# Patient Record
Sex: Male | Born: 1970 | Race: White | Hispanic: No | Marital: Married | Smoking: Current every day smoker
Health system: Southern US, Community
[De-identification: ages and names within clinical notes are randomized; demographics above are authoritative.]

## PROBLEM LIST (undated history)

## (undated) DIAGNOSIS — F32A Depression, unspecified: Secondary | ICD-10-CM

## (undated) DIAGNOSIS — F419 Anxiety disorder, unspecified: Secondary | ICD-10-CM

## (undated) DIAGNOSIS — IMO0001 Reserved for inherently not codable concepts without codable children: Secondary | ICD-10-CM

## (undated) DIAGNOSIS — R519 Headache, unspecified: Secondary | ICD-10-CM

## (undated) DIAGNOSIS — G589 Mononeuropathy, unspecified: Secondary | ICD-10-CM

## (undated) DIAGNOSIS — F1910 Other psychoactive substance abuse, uncomplicated: Secondary | ICD-10-CM

## (undated) DIAGNOSIS — R51 Headache: Secondary | ICD-10-CM

## (undated) HISTORY — PX: OTHER SURGICAL HISTORY: SHX169

## (undated) HISTORY — DX: Other psychoactive substance abuse, uncomplicated: F19.10

## (undated) HISTORY — DX: Depression, unspecified: F32.A

---

## 2002-11-05 ENCOUNTER — Encounter: Payer: Self-pay | Admitting: Emergency Medicine

## 2002-11-05 ENCOUNTER — Emergency Department (HOSPITAL_COMMUNITY): Admission: EM | Admit: 2002-11-05 | Discharge: 2002-11-05 | Payer: Self-pay | Admitting: Emergency Medicine

## 2008-03-29 ENCOUNTER — Emergency Department (HOSPITAL_BASED_OUTPATIENT_CLINIC_OR_DEPARTMENT_OTHER): Admission: EM | Admit: 2008-03-29 | Discharge: 2008-03-29 | Payer: Self-pay | Admitting: Emergency Medicine

## 2008-08-29 ENCOUNTER — Ambulatory Visit: Payer: Self-pay | Admitting: Diagnostic Radiology

## 2008-08-29 ENCOUNTER — Emergency Department (HOSPITAL_BASED_OUTPATIENT_CLINIC_OR_DEPARTMENT_OTHER): Admission: EM | Admit: 2008-08-29 | Discharge: 2008-08-29 | Payer: Self-pay | Admitting: Emergency Medicine

## 2008-10-10 ENCOUNTER — Emergency Department (HOSPITAL_BASED_OUTPATIENT_CLINIC_OR_DEPARTMENT_OTHER): Admission: EM | Admit: 2008-10-10 | Discharge: 2008-10-10 | Payer: Self-pay | Admitting: Emergency Medicine

## 2008-10-10 ENCOUNTER — Ambulatory Visit: Payer: Self-pay | Admitting: Diagnostic Radiology

## 2009-05-05 ENCOUNTER — Emergency Department (HOSPITAL_BASED_OUTPATIENT_CLINIC_OR_DEPARTMENT_OTHER): Admission: EM | Admit: 2009-05-05 | Discharge: 2009-05-05 | Payer: Self-pay | Admitting: Emergency Medicine

## 2009-08-01 ENCOUNTER — Emergency Department (HOSPITAL_BASED_OUTPATIENT_CLINIC_OR_DEPARTMENT_OTHER): Admission: EM | Admit: 2009-08-01 | Discharge: 2009-08-01 | Payer: Self-pay | Admitting: Emergency Medicine

## 2010-10-05 ENCOUNTER — Emergency Department (HOSPITAL_BASED_OUTPATIENT_CLINIC_OR_DEPARTMENT_OTHER)
Admission: EM | Admit: 2010-10-05 | Discharge: 2010-10-05 | Payer: Self-pay | Source: Home / Self Care | Admitting: Emergency Medicine

## 2010-12-19 LAB — BASIC METABOLIC PANEL
BUN: 17 mg/dL (ref 6–23)
CO2: 29 mEq/L (ref 19–32)
Calcium: 10.1 mg/dL (ref 8.4–10.5)
Chloride: 108 mEq/L (ref 96–112)
Creatinine, Ser: 0.9 mg/dL (ref 0.4–1.5)
GFR calc Af Amer: >60 mL/min (ref >60)
GFR calc non Af Amer: >60 mL/min (ref >60)
Glucose, Bld: 123 mg/dL — ABNORMAL HIGH (ref 70–99)
Potassium: 3.6 mEq/L (ref 3.5–5.1)
Sodium: 147 mEq/L — ABNORMAL HIGH (ref 135–145)

## 2010-12-19 LAB — DIFFERENTIAL
Basophils Relative: 1 % (ref 0–1)
Lymphocytes Relative: 13 % (ref 12–46)
Lymphs Abs: 1.1 10*3/uL (ref 0.7–4.0)
Monocytes Absolute: 0.5 10*3/uL (ref 0.1–1.0)
Monocytes Relative: 6 % (ref 3–12)
Neutro Abs: 6.7 10*3/uL (ref 1.7–7.7)

## 2010-12-19 LAB — URINE MICROSCOPIC-ADD ON

## 2010-12-19 LAB — URINALYSIS, ROUTINE W REFLEX MICROSCOPIC
Bilirubin Urine: NEGATIVE
Glucose, UA: NEGATIVE mg/dL
Hgb urine dipstick: NEGATIVE
Specific Gravity, Urine: 1.03 (ref 1.005–1.030)
pH: 8 (ref 5.0–8.0)

## 2010-12-19 LAB — CBC
Hemoglobin: 15.4 g/dL (ref 13.0–17.0)
MCHC: 33.7 g/dL (ref 30.0–36.0)
RBC: 4.89 MIL/uL (ref 4.22–5.81)

## 2010-12-19 LAB — POCT TOXICOLOGY PANEL: Tetrahydrocannabinol: POSITIVE

## 2010-12-19 LAB — ETHANOL: Alcohol, Ethyl (B): <5 mg/dL (ref 0–10)

## 2010-12-28 LAB — URINALYSIS, ROUTINE W REFLEX MICROSCOPIC
Glucose, UA: NEGATIVE mg/dL
Nitrite: NEGATIVE
pH: 6 (ref 5.0–8.0)

## 2010-12-28 LAB — URINE CULTURE
Colony Count: NO GROWTH
Culture: NO GROWTH

## 2014-07-04 ENCOUNTER — Other Ambulatory Visit: Payer: Self-pay | Admitting: Neurosurgery

## 2014-08-12 ENCOUNTER — Inpatient Hospital Stay (HOSPITAL_COMMUNITY)
Admission: RE | Admit: 2014-08-12 | Discharge: 2014-08-12 | Disposition: A | Discharge status: Home / Self Care | Payer: Self-pay | Source: Ambulatory Visit

## 2014-08-16 ENCOUNTER — Encounter (HOSPITAL_COMMUNITY): Payer: Self-pay

## 2014-08-16 ENCOUNTER — Encounter (HOSPITAL_COMMUNITY)
Admission: RE | Admit: 2014-08-16 | Discharge: 2014-08-16 | Disposition: A | Discharge status: Home / Self Care | Payer: Medicaid Other | Source: Ambulatory Visit | Attending: Neurosurgery | Admitting: Neurosurgery

## 2014-08-16 DIAGNOSIS — M47892 Other spondylosis, cervical region: Secondary | ICD-10-CM | POA: Diagnosis not present

## 2014-08-16 DIAGNOSIS — F419 Anxiety disorder, unspecified: Secondary | ICD-10-CM | POA: Diagnosis not present

## 2014-08-16 DIAGNOSIS — M4802 Spinal stenosis, cervical region: Secondary | ICD-10-CM | POA: Diagnosis present

## 2014-08-16 DIAGNOSIS — F17210 Nicotine dependence, cigarettes, uncomplicated: Secondary | ICD-10-CM | POA: Diagnosis not present

## 2014-08-16 HISTORY — DX: Anxiety disorder, unspecified: F41.9

## 2014-08-16 HISTORY — DX: Reserved for inherently not codable concepts without codable children: IMO0001

## 2014-08-16 HISTORY — DX: Headache, unspecified: R51.9

## 2014-08-16 HISTORY — DX: Headache: R51

## 2014-08-16 LAB — BASIC METABOLIC PANEL
ANION GAP: 12 (ref 5–15)
BUN: 23 mg/dL (ref 6–23)
CO2: 29 mEq/L (ref 19–32)
CREATININE: 0.79 mg/dL (ref 0.50–1.35)
Calcium: 9.9 mg/dL (ref 8.4–10.5)
Chloride: 100 mEq/L (ref 96–112)
Glucose, Bld: 85 mg/dL (ref 70–99)
POTASSIUM: 4.7 meq/L (ref 3.7–5.3)
Sodium: 141 mEq/L (ref 137–147)

## 2014-08-16 LAB — CBC
HCT: 42.9 % (ref 39.0–52.0)
Hemoglobin: 15.3 g/dL (ref 13.0–17.0)
MCH: 32 pg (ref 26.0–34.0)
MCHC: 35.7 g/dL (ref 30.0–36.0)
MCV: 89.7 fL (ref 78.0–100.0)
PLATELETS: 213 10*3/uL (ref 150–400)
RBC: 4.78 MIL/uL (ref 4.22–5.81)
RDW: 12.8 % (ref 11.5–15.5)
WBC: 6.8 10*3/uL (ref 4.0–10.5)

## 2014-08-16 LAB — SURGICAL PCR SCREEN
MRSA, PCR: NEGATIVE
STAPHYLOCOCCUS AUREUS: POSITIVE — AB

## 2014-08-16 NOTE — Pre-Procedure Instructions (Signed)
Jacob Rhodes  08/16/2014   Your procedure is scheduled on:  Mon, Dec 7 @ 7:30 AM  [facility]  at 5:30 AM.  Call this number if you have problems the morning of surgery: [phone]   Remember:   Do not eat food or drink liquids after midnight.   Take these medicines the morning of surgery with A SIP OF WATER: neurontin, ultram              No Goody's,BC's,Aspirin,Aleve,Ibuprofen,Fish Oil,or any Herbal Medications   Do not wear jewelry.  Do not wear lotions, powders, or colognes. You may wear deodorant.  Men may shave face and neck.  Do not bring valuables to the hospital.  [facility] is not responsible for any belongings or valuables.               Contacts, dentures or bridgework may not be worn into surgery.  Leave suitcase in the car. After surgery it may be brought to your room.  For patients admitted to the hospital, discharge time is determined by your treatment team.               Patients discharged the day of surgery will not be allowed to drive home.    [facility] - Preparing for Surgery  Before surgery, you can play an important role.  Because skin is not sterile, your skin needs to be as free of germs as possible.  You can reduce the number of germs on you skin by washing with CHG (chlorahexidine gluconate) soap before surgery.  CHG is an antiseptic cleaner which kills germs and bonds with the skin to continue killing germs even after washing.  Please DO NOT use if you have an allergy to CHG or antibacterial soaps.  If your skin becomes reddened/irritated stop using the CHG and inform your nurse when you arrive at Short Stay.  Do not shave (including legs and underarms) for at least 48 hours prior to the first CHG shower.  You may shave your face.  Please follow these instructions carefully:   1.  Shower with CHG Soap the night before surgery and the morning of Surgery.  2.  If you choose to wash your hair, wash your hair first as usual  with your normal shampoo.  3.  After you shampoo, rinse your hair and body thoroughly to remove the shampoo.  4.  Use CHG as you would any other liquid soap.  You can apply CHG directly to the skin and wash gently with scrungie or a clean washcloth.  5.  Apply the CHG Soap to your body ONLY FROM THE NECK DOWN.  Do not use on open wounds or open sores.  Avoid contact with your eyes, ears, mouth and genitals (private parts).  Wash genitals (private parts) with your normal soap.  6.  Wash thoroughly, paying special attention to the area where your surgery will be performed.  7.  Thoroughly rinse your body with warm water from the neck down.  8.  DO NOT shower/wash with your normal soap after using and rinsing off the CHG Soap.  9.  Pat yourself dry with a clean towel.            10.  Wear clean pajamas.            11.  Place clean sheets on your bed the night of your first shower and do not sleep with pets.  Day of Surgery  Do  not apply any lotions/deoderants the morning of surgery.  Please wear clean clothes to the hospital/surgery center.

## 2014-08-16 NOTE — Progress Notes (Signed)
I called a prescription for Mupiro[facility] ointment to [facility], [place], [place]

## 2014-08-16 NOTE — Progress Notes (Signed)
Primary - dr.borum - [place] No cardiologist No prior cardiac testing

## 2014-08-18 MED ORDER — CEFAZOLIN SODIUM-DEXTROSE 2-3 GM-% IV SOLR
2.0000 g | INTRAVENOUS | Status: AC
  Administered 2014-08-19: 2 g via INTRAVENOUS
  Filled 2014-08-18: qty 50

## 2014-08-19 ENCOUNTER — Ambulatory Visit (HOSPITAL_COMMUNITY): Payer: Medicaid Other

## 2014-08-19 ENCOUNTER — Ambulatory Visit (HOSPITAL_COMMUNITY): Payer: Medicaid Other | Admitting: Anesthesiology

## 2014-08-19 ENCOUNTER — Ambulatory Visit (HOSPITAL_COMMUNITY)
Admission: RE | Admit: 2014-08-19 | Discharge: 2014-08-19 | Disposition: A | Discharge status: Home / Self Care | Payer: Medicaid Other | Source: Ambulatory Visit | Attending: Neurosurgery | Admitting: Neurosurgery

## 2014-08-19 ENCOUNTER — Encounter (HOSPITAL_COMMUNITY)
Admission: RE | Disposition: A | Discharge status: Home / Self Care | Payer: Medicaid Other | Source: Ambulatory Visit | Attending: Neurosurgery

## 2014-08-19 DIAGNOSIS — F17210 Nicotine dependence, cigarettes, uncomplicated: Secondary | ICD-10-CM | POA: Diagnosis not present

## 2014-08-19 DIAGNOSIS — M4802 Spinal stenosis, cervical region: Secondary | ICD-10-CM | POA: Diagnosis present

## 2014-08-19 DIAGNOSIS — F419 Anxiety disorder, unspecified: Secondary | ICD-10-CM | POA: Diagnosis not present

## 2014-08-19 DIAGNOSIS — M47892 Other spondylosis, cervical region: Secondary | ICD-10-CM | POA: Insufficient documentation

## 2014-08-19 DIAGNOSIS — Z419 Encounter for procedure for purposes other than remedying health state, unspecified: Secondary | ICD-10-CM

## 2014-08-19 HISTORY — PX: ANTERIOR CERVICAL DECOMP/DISCECTOMY FUSION: SHX1161

## 2014-08-19 LAB — GLUCOSE, CAPILLARY: Glucose-Capillary: 158 mg/dL — ABNORMAL HIGH (ref 70–99)

## 2014-08-19 SURGERY — ANTERIOR CERVICAL DECOMPRESSION/DISCECTOMY FUSION 1 LEVEL
Anesthesia: General | Site: Neck

## 2014-08-19 MED ORDER — GABAPENTIN 800 MG PO TABS
800.0000 mg | ORAL_TABLET | Freq: Three times a day (TID) | ORAL | Status: DC
  Filled 2014-08-19 (×3): qty 1

## 2014-08-19 MED ORDER — NEOSTIGMINE METHYLSULFATE 10 MG/10ML IV SOLN
INTRAVENOUS | Status: AC
  Filled 2014-08-19: qty 1

## 2014-08-19 MED ORDER — THROMBIN 5000 UNITS EX SOLR
OROMUCOSAL | Status: DC | PRN
  Administered 2014-08-19: 08:00:00

## 2014-08-19 MED ORDER — EPHEDRINE SULFATE 50 MG/ML IJ SOLN
INTRAMUSCULAR | Status: AC
  Filled 2014-08-19: qty 1

## 2014-08-19 MED ORDER — ARTIFICIAL TEARS OP OINT
TOPICAL_OINTMENT | OPHTHALMIC | Status: AC
  Filled 2014-08-19: qty 3.5

## 2014-08-19 MED ORDER — HYDROMORPHONE HCL 1 MG/ML IJ SOLN
0.5000 mg | INTRAMUSCULAR | Status: DC | PRN
  Administered 2014-08-19: 1 mg via INTRAVENOUS

## 2014-08-19 MED ORDER — 0.9 % SODIUM CHLORIDE (POUR BTL) OPTIME
TOPICAL | Status: DC | PRN
  Administered 2014-08-19: 1000 mL

## 2014-08-19 MED ORDER — HYDROMORPHONE HCL 1 MG/ML IJ SOLN
INTRAMUSCULAR | Status: AC
  Filled 2014-08-19: qty 1

## 2014-08-19 MED ORDER — DEXAMETHASONE SODIUM PHOSPHATE 10 MG/ML IJ SOLN
10.0000 mg | INTRAMUSCULAR | Status: AC
  Administered 2014-08-19: 10 mg via INTRAVENOUS

## 2014-08-19 MED ORDER — ROCURONIUM BROMIDE 50 MG/5ML IV SOLN
INTRAVENOUS | Status: AC
  Filled 2014-08-19: qty 1

## 2014-08-19 MED ORDER — HEMOSTATIC AGENTS (NO CHARGE) OPTIME
TOPICAL | Status: DC | PRN
  Administered 2014-08-19: 1 via TOPICAL

## 2014-08-19 MED ORDER — HYDROMORPHONE HCL 1 MG/ML IJ SOLN
0.2500 mg | INTRAMUSCULAR | Status: DC | PRN
  Administered 2014-08-19 (×4): 0.5 mg via INTRAVENOUS

## 2014-08-19 MED ORDER — ACETAMINOPHEN 650 MG RE SUPP: 650.0000 mg | RECTAL | Status: DC | PRN

## 2014-08-19 MED ORDER — FENTANYL CITRATE 0.05 MG/ML IJ SOLN
INTRAMUSCULAR | Status: AC
  Filled 2014-08-19: qty 5

## 2014-08-19 MED ORDER — NEOSTIGMINE METHYLSULFATE 10 MG/10ML IV SOLN
INTRAVENOUS | Status: DC | PRN
  Administered 2014-08-19: 4 mg via INTRAVENOUS

## 2014-08-19 MED ORDER — MUPIROCIN 2 % EX OINT
TOPICAL_OINTMENT | Freq: Two times a day (BID) | CUTANEOUS | Status: DC
  Administered 2014-08-19: 1 via NASAL
  Filled 2014-08-19 (×2): qty 22

## 2014-08-19 MED ORDER — LIDOCAINE HCL (CARDIAC) 20 MG/ML IV SOLN
INTRAVENOUS | Status: AC
  Filled 2014-08-19: qty 5

## 2014-08-19 MED ORDER — SODIUM CHLORIDE 0.9 % IR SOLN
Status: DC | PRN
  Administered 2014-08-19: 500 mL

## 2014-08-19 MED ORDER — ONDANSETRON HCL 4 MG/2ML IJ SOLN
INTRAMUSCULAR | Status: DC | PRN
  Administered 2014-08-19: 4 mg via INTRAVENOUS

## 2014-08-19 MED ORDER — MENTHOL 3 MG MT LOZG: 1.0000 | LOZENGE | OROMUCOSAL | Status: DC | PRN

## 2014-08-19 MED ORDER — SODIUM CHLORIDE 0.9 % IJ SOLN
3.0000 mL | Freq: Two times a day (BID) | INTRAMUSCULAR | Status: DC
  Administered 2014-08-19: 3 mL via INTRAVENOUS

## 2014-08-19 MED ORDER — OXYCODONE HCL 5 MG PO TABS
5.0000 mg | ORAL_TABLET | Freq: Once | ORAL | Status: AC | PRN
  Administered 2014-08-19: 5 mg via ORAL

## 2014-08-19 MED ORDER — OXYCODONE HCL 5 MG/5ML PO SOLN: 5.0000 mg | Freq: Once | ORAL | Status: AC | PRN

## 2014-08-19 MED ORDER — MIDAZOLAM HCL 5 MG/5ML IJ SOLN
INTRAMUSCULAR | Status: DC | PRN
  Administered 2014-08-19: 2 mg via INTRAVENOUS

## 2014-08-19 MED ORDER — PHENOL 1.4 % MT LIQD: 1.0000 | OROMUCOSAL | Status: DC | PRN

## 2014-08-19 MED ORDER — MIDAZOLAM HCL 2 MG/2ML IJ SOLN
INTRAMUSCULAR | Status: AC
  Filled 2014-08-19: qty 2

## 2014-08-19 MED ORDER — DEXAMETHASONE SODIUM PHOSPHATE 10 MG/ML IJ SOLN
INTRAMUSCULAR | Status: AC
  Filled 2014-08-19: qty 1

## 2014-08-19 MED ORDER — CYCLOBENZAPRINE HCL 10 MG PO TABS
ORAL_TABLET | ORAL | Status: AC
  Filled 2014-08-19: qty 1

## 2014-08-19 MED ORDER — CYCLOBENZAPRINE HCL 10 MG PO TABS
10.0000 mg | ORAL_TABLET | Freq: Three times a day (TID) | ORAL | Status: DC | PRN
  Administered 2014-08-19: 10 mg via ORAL

## 2014-08-19 MED ORDER — ROCURONIUM BROMIDE 100 MG/10ML IV SOLN
INTRAVENOUS | Status: DC | PRN
Start: 2014-08-19 — End: 2014-08-19
  Administered 2014-08-19: 50 mg via INTRAVENOUS

## 2014-08-19 MED ORDER — OXYCODONE HCL 5 MG PO TABS
ORAL_TABLET | ORAL | Status: AC
  Filled 2014-08-19: qty 1

## 2014-08-19 MED ORDER — MUPIROCIN 2 % EX OINT
TOPICAL_OINTMENT | CUTANEOUS | Status: AC
  Filled 2014-08-19: qty 22

## 2014-08-19 MED ORDER — THROMBIN 5000 UNITS EX SOLR
CUTANEOUS | Status: DC | PRN
  Administered 2014-08-19 (×2): 5000 [IU] via TOPICAL

## 2014-08-19 MED ORDER — GLYCOPYRROLATE 0.2 MG/ML IJ SOLN
INTRAMUSCULAR | Status: AC
  Filled 2014-08-19: qty 3

## 2014-08-19 MED ORDER — SUCCINYLCHOLINE CHLORIDE 20 MG/ML IJ SOLN
INTRAMUSCULAR | Status: AC
  Filled 2014-08-19: qty 1

## 2014-08-19 MED ORDER — SODIUM CHLORIDE 0.9 % IJ SOLN
INTRAMUSCULAR | Status: AC
  Filled 2014-08-19: qty 10

## 2014-08-19 MED ORDER — TRAMADOL HCL 50 MG PO TABS: 50.0000 mg | ORAL_TABLET | Freq: Four times a day (QID) | ORAL | Status: DC

## 2014-08-19 MED ORDER — GLYCOPYRROLATE 0.2 MG/ML IJ SOLN
INTRAMUSCULAR | Status: DC | PRN
  Administered 2014-08-19: 0.6 mg via INTRAVENOUS

## 2014-08-19 MED ORDER — OXYCODONE-ACETAMINOPHEN 5-325 MG PO TABS
1.0000 | ORAL_TABLET | ORAL | Status: DC | PRN
  Administered 2014-08-19: 2 via ORAL
  Filled 2014-08-19: qty 2

## 2014-08-19 MED ORDER — ONDANSETRON HCL 4 MG/2ML IJ SOLN: 4.0000 mg | INTRAMUSCULAR | Status: DC | PRN

## 2014-08-19 MED ORDER — FENTANYL CITRATE 0.05 MG/ML IJ SOLN
INTRAMUSCULAR | Status: DC | PRN
  Administered 2014-08-19: 150 ug via INTRAVENOUS
  Administered 2014-08-19: 50 ug via INTRAVENOUS
  Administered 2014-08-19: 100 ug via INTRAVENOUS

## 2014-08-19 MED ORDER — PROPOFOL 10 MG/ML IV BOLUS
INTRAVENOUS | Status: AC
  Filled 2014-08-19: qty 20

## 2014-08-19 MED ORDER — DOCUSATE SODIUM 100 MG PO CAPS
100.0000 mg | ORAL_CAPSULE | Freq: Two times a day (BID) | ORAL | Status: DC
  Filled 2014-08-19 (×2): qty 1

## 2014-08-19 MED ORDER — LACTATED RINGERS IV SOLN
INTRAVENOUS | Status: DC | PRN
  Administered 2014-08-19 (×2): via INTRAVENOUS

## 2014-08-19 MED ORDER — ACETAMINOPHEN 325 MG PO TABS: 650.0000 mg | ORAL_TABLET | ORAL | Status: DC | PRN

## 2014-08-19 MED ORDER — GABAPENTIN 400 MG PO CAPS
1100.0000 mg | ORAL_CAPSULE | Freq: Three times a day (TID) | ORAL | Status: DC
  Administered 2014-08-19: 1100 mg via ORAL
  Filled 2014-08-19 (×3): qty 1

## 2014-08-19 MED ORDER — ONDANSETRON HCL 4 MG/2ML IJ SOLN
INTRAMUSCULAR | Status: AC
  Filled 2014-08-19: qty 2

## 2014-08-19 MED ORDER — LIDOCAINE HCL (CARDIAC) 20 MG/ML IV SOLN
INTRAVENOUS | Status: DC | PRN
  Administered 2014-08-19: 40 mg via INTRAVENOUS

## 2014-08-19 MED ORDER — SODIUM CHLORIDE 0.9 % IJ SOLN: 3.0000 mL | INTRAMUSCULAR | Status: DC | PRN

## 2014-08-19 MED ORDER — ALUM & MAG HYDROXIDE-SIMETH 200-200-20 MG/5ML PO SUSP: 30.0000 mL | Freq: Four times a day (QID) | ORAL | Status: DC | PRN

## 2014-08-19 MED ORDER — PROPOFOL 10 MG/ML IV BOLUS
INTRAVENOUS | Status: DC | PRN
  Administered 2014-08-19: 200 mg via INTRAVENOUS

## 2014-08-19 MED ORDER — CEFAZOLIN SODIUM 1-5 GM-% IV SOLN
1.0000 g | Freq: Three times a day (TID) | INTRAVENOUS | Status: DC
  Administered 2014-08-19: 1 g via INTRAVENOUS
  Filled 2014-08-19 (×2): qty 50

## 2014-08-19 MED ORDER — GABAPENTIN 300 MG PO CAPS
300.0000 mg | ORAL_CAPSULE | Freq: Three times a day (TID) | ORAL | Status: DC
  Filled 2014-08-19 (×3): qty 1

## 2014-08-19 SURGICAL SUPPLY — 61 items
APL SKNCLS STERI-STRIP NONHPOA (GAUZE/BANDAGES/DRESSINGS) ×1
BAG DECANTER FOR FLEXI CONT (MISCELLANEOUS) ×3 IMPLANT
BENZOIN TINCTURE PRP APPL 2/3 (GAUZE/BANDAGES/DRESSINGS) ×3 IMPLANT
BIT DRILL SM SPINE QC 14 (BIT) ×3 IMPLANT
BONE ALLOSTEM MORSELIZED 5CC (Bone Implant) ×3 IMPLANT
BRUSH SCRUB EZ PLAIN DRY (MISCELLANEOUS) ×3 IMPLANT
BUR MATCHSTICK NEURO 3.0 LAGG (BURR) ×3 IMPLANT
CANISTER SUCT 3000ML (MISCELLANEOUS) ×3 IMPLANT
CLOSURE WOUND 1/2 X4 (GAUZE/BANDAGES/DRESSINGS) ×1
CONT SPEC 4OZ CLIKSEAL STRL BL (MISCELLANEOUS) ×3 IMPLANT
DRAPE C-ARM 42X72 X-RAY (DRAPES) ×6 IMPLANT
DRAPE LAPAROTOMY 100X72 PEDS (DRAPES) ×3 IMPLANT
DRAPE MICROSCOPE LEICA (MISCELLANEOUS) ×3 IMPLANT
DRAPE POUCH INSTRU U-SHP 10X18 (DRAPES) ×3 IMPLANT
DRSG OPSITE POSTOP 4X6 (GAUZE/BANDAGES/DRESSINGS) ×3 IMPLANT
DURAPREP 6ML APPLICATOR 50/CS (WOUND CARE) ×3 IMPLANT
ELECT COATED BLADE 2.86 ST (ELECTRODE) ×3 IMPLANT
ELECT REM PT RETURN 9FT ADLT (ELECTROSURGICAL) ×3
ELECTRODE REM PT RTRN 9FT ADLT (ELECTROSURGICAL) ×1 IMPLANT
GAUZE SPONGE 4X4 12PLY STRL (GAUZE/BANDAGES/DRESSINGS) ×3 IMPLANT
GAUZE SPONGE 4X4 16PLY XRAY LF (GAUZE/BANDAGES/DRESSINGS) IMPLANT
GLOVE BIO SURGEON STRL SZ8 (GLOVE) ×3 IMPLANT
GLOVE BIOGEL PI IND STRL 8 (GLOVE) ×1 IMPLANT
GLOVE BIOGEL PI INDICATOR 8 (GLOVE) ×2
GLOVE ECLIPSE 7.5 STRL STRAW (GLOVE) ×9 IMPLANT
GLOVE EXAM NITRILE LRG STRL (GLOVE) IMPLANT
GLOVE EXAM NITRILE MD LF STRL (GLOVE) IMPLANT
GLOVE EXAM NITRILE XL STR (GLOVE) IMPLANT
GLOVE EXAM NITRILE XS STR PU (GLOVE) IMPLANT
GLOVE INDICATOR 8.5 STRL (GLOVE) ×3 IMPLANT
GOWN STRL REUS W/ TWL LRG LVL3 (GOWN DISPOSABLE) ×1 IMPLANT
GOWN STRL REUS W/ TWL XL LVL3 (GOWN DISPOSABLE) ×1 IMPLANT
GOWN STRL REUS W/TWL 2XL LVL3 (GOWN DISPOSABLE) ×3 IMPLANT
GOWN STRL REUS W/TWL LRG LVL3 (GOWN DISPOSABLE) ×3
GOWN STRL REUS W/TWL XL LVL3 (GOWN DISPOSABLE) ×3
HALTER HD/CHIN CERV TRACTION D (MISCELLANEOUS) ×3 IMPLANT
HEMOSTAT POWDER KIT SURGIFOAM (HEMOSTASIS) ×3 IMPLANT
KIT BASIN OR (CUSTOM PROCEDURE TRAY) ×3 IMPLANT
KIT ROOM TURNOVER OR (KITS) ×3 IMPLANT
LIQUID BAND (GAUZE/BANDAGES/DRESSINGS) ×3 IMPLANT
NEEDLE HYPO 18GX1.5 BLUNT FILL (NEEDLE) IMPLANT
NEEDLE SPNL 20GX3.5 QUINCKE YW (NEEDLE) ×3 IMPLANT
NS IRRIG 1000ML POUR BTL (IV SOLUTION) ×3 IMPLANT
PACK LAMINECTOMY NEURO (CUSTOM PROCEDURE TRAY) ×3 IMPLANT
PAD ARMBOARD 7.5X6 YLW CONV (MISCELLANEOUS) ×9 IMPLANT
PIN DISTRACTION 14MM (PIN) ×6 IMPLANT
PLATE ANT CERV XTEND 1 LV 14 (Plate) ×3 IMPLANT
RUBBERBAND STERILE (MISCELLANEOUS) ×6 IMPLANT
SCREW XTD VAR 4.2 SELF TAP (Screw) ×12 IMPLANT
SPACER COLONIAL 7X14X12 (Spacer) ×3 IMPLANT
SPONGE INTESTINAL PEANUT (DISPOSABLE) ×3 IMPLANT
SPONGE SURGIFOAM ABS GEL SZ50 (HEMOSTASIS) ×3 IMPLANT
STRIP CLOSURE SKIN 1/2X4 (GAUZE/BANDAGES/DRESSINGS) ×2 IMPLANT
SUT VIC AB 3-0 SH 8-18 (SUTURE) ×3 IMPLANT
SUT VICRYL 4-0 PS2 18IN ABS (SUTURE) ×3 IMPLANT
SYR 20ML ECCENTRIC (SYRINGE) ×3 IMPLANT
TAPE CLOTH 4X10 WHT NS (GAUZE/BANDAGES/DRESSINGS) IMPLANT
TOWEL OR 17X24 6PK STRL BLUE (TOWEL DISPOSABLE) ×3 IMPLANT
TOWEL OR 17X26 10 PK STRL BLUE (TOWEL DISPOSABLE) ×3 IMPLANT
TRAP SPECIMEN MUCOUS 40CC (MISCELLANEOUS) ×3 IMPLANT
WATER STERILE IRR 1000ML POUR (IV SOLUTION) ×3 IMPLANT

## 2014-08-19 NOTE — H&P (Signed)
Gershon Craneravis Sult is an 43 y.o. male.   Chief Complaint: Neck and right shoulder pain HPI: Patient is a 43 year old M is a progress worsening neck and pain to the top of his right shoulder consistent with a C4 nerve root distribution. Workup revealed spondylosis stenosis spur and severe foraminal stenosis at C3-4. Patient failed all forms of conservative treatment with physical therapy anti-inflammatories and time. Imaging with flexion extension showed evidence of excessive motion at C3-4. Due the patient's failure conservative treatment imaging findings and progression of clinical syndrome I recommended an ACDF at C3-4. I extensively reviewed the risks and benefits of the operation with the patient as well as perioperative course expectations of outcome and alternatives of surgery and he understood and agreed to proceed forward.  Past Medical History  Diagnosis Date  . Anxiety   . Shortness of breath dyspnea     anxiety related  . Headache     Past Surgical History  Procedure Laterality Date  . Plyoric stenosis surgery as infant      No family history on file. Social History:  reports that he has been smoking Cigars.  He does not have any smokeless tobacco history on file. He reports that he does not drink alcohol or use illicit drugs.  Allergies: No Known Allergies  Medications Prior to Admission  Medication Sig Dispense Refill  . gabapentin (NEURONTIN) 300 MG capsule Take 300 mg by mouth 3 (three) times daily. Take in addition to 800 mg to equal 1100 mg  11  . gabapentin (NEURONTIN) 800 MG tablet Take 800 mg by mouth 3 (three) times daily. Take in addition to 300 mg to equal 1100 mg  1  . traMADol (ULTRAM) 50 MG tablet Take 50 mg by mouth 4 (four) times daily.  2    No results found for this or any previous visit (from the past 48 hour(s)). No results found.  Review of Systems  Constitutional: Negative.   Eyes: Negative.   Respiratory: Negative.   Cardiovascular: Negative.    Gastrointestinal: Negative.   Genitourinary: Negative.   Musculoskeletal: Positive for myalgias, joint pain and neck pain.  Skin: Negative.   Neurological: Negative.   Psychiatric/Behavioral: Negative.     Blood pressure 97/59, pulse 59, temperature 97.3 F (36.3 C), temperature source Oral, resp. rate 18, height 6' (1.829 m), weight 76.658 kg (169 lb), SpO2 96 %. Physical Exam  Constitutional: He is oriented to person, place, and time. He appears well-developed and well-nourished.  HENT:  Head: Normocephalic.  Eyes: Pupils are equal, round, and reactive to light.  Neck: Normal range of motion.  Cardiovascular: Normal rate.   Respiratory: Effort normal.  GI: Soft. Bowel sounds are normal.  Neurological: He is alert and oriented to person, place, and time. He has normal strength. GCS eye subscore is 4. GCS verbal subscore is 5. GCS motor subscore is 6.  Reflex Scores:      Tricep reflexes are 2+ on the right side and 2+ on the left side.      Bicep reflexes are 2+ on the right side and 2+ on the left side.      Brachioradialis reflexes are 2+ on the right side and 2+ on the left side.      Patellar reflexes are 2+ on the right side and 2+ on the left side.      Achilles reflexes are 2+ on the right side and 2+ on the left side. Strength is 5 out of 5 in his deltoid,  bicep, tricep, wrist flexion, wrist extension, hand intrinsics.  Skin: Skin is warm and dry.     Assessment/Plan 43 year old gentleman presents for an ACDF at C3-4.  Ernesha Ramone P 08/19/2014, 7:12 AM

## 2014-08-19 NOTE — Op Note (Signed)
Preoperative diagnosis: Cervical spondylosis with stenosis at C3-4  Postoperative diagnosis: Same  Procedure: Anterior cervical discectomy and fusion at C3-4 utilizing the globus peek cage packed with locally harvested autograft mixed with allostem morsels and the globus extend plating system 14 mm plate with 1-614-14 mm variable angle screws  Surgeon: Jillyn HiddenGary Jassica Zazueta  Anesthesia: Gen.  EBL: Minimal  History of present illness: Patient is a very pleasant 43 year old gentleman has had on seeing neck pain and pain in both shoulders probably in the right refractory to all forms of conservative treatment. Workup revealed a subluxation of C3 on 4 with stenosis and foraminal compression and stability and flexion extension films. Patient was recommended anterior cervical discectomy fusion at C3-4. I extensively reviewed the risks and benefits of the operation with the patient as well as perioperative course expectations of outcome and alternatives of surgery and he understands and agrees to proceed forward.  Operative procedure: Patient brought into the or was induced under general anesthesia positioned supine neck in slight extension in 5 pounds of halter traction the right side of his neck was prepped And draped in routine sterile fashion. Preoperative x-ray localize the appropriate level. A curvilinear incision was made just off midline to the anterior border of the sternocleidomastoid and the superficial layer of the platysmas dissected and divided longitudinally the avascular plane to sternomastoid and strap as was developed and the prevertebral Fascia dissected with Kitners. Interoperative x-ray confirmed identification of the appropriate level. Annulotomy was made with 15 blade scalpel marked the disc space anterior os that was bitten off of the 3 mm Kerrison punch disc spaces and scraped with a BA curette empty and disc was removed pituitary rongeurs. Then using a high-speed drill capturing the bone shavings in  a mucous trap this disc space was drilled down the posterior annulus and ossific complex. Under my scope illumination this was further drilled down and then working underneath the annulus and posterior longitudinal ligament at 2 mm Kerrison punch grossly under been both endplates was a large posterior spur coming off the C3 vertebral body causing severe thecal sac compression this was aggressively under bitten decompress the central canal. Marking laterally identified both C4 pedicles in both C4 nerve roots were skeletonized flush with pedicle. I utilized distracting pins opened up the disc space this also significantly reduce the deformity. After adequate endplate preparation been achieved and the decompression was complete I selected a 7 mm peek cage packed with local autograft mixed with allostem morsels. This is a 71-2 minutes into the anterior vertebral line and then a 14 mm plate was selected all screws excellent purchase locking mechanism was engaged and posterior fluoroscopy confirmed good position of the implant. The wounds and to see her get meticulous he states was maintained the wounds and closed in layers with interrupted Vicryl the skin was closed with a running 4 subcuticular benzoin and Steri-Strips were applied and patient recovered in stable condition. At the end the case all needle counts and sponge counts were correct.

## 2014-08-19 NOTE — Progress Notes (Signed)
Patient alert and oriented, mae's well, voiding adequate amount of urine, swallowing without difficulty, no c/o pain. Patient discharged home with family. Script and discharged instructions given to patient. Patient and family stated understanding of d/c instructions given and has an appointment with MD. Aisha RN  

## 2014-08-19 NOTE — Anesthesia Postprocedure Evaluation (Signed)
  Anesthesia Post-op Note  Patient: Jacob Rhodes  Procedure(s) Performed: Procedure(s): cervical three -four anterior cervical decompression fusion with peek and plate (N/A)  Patient Location: PACU  Anesthesia Type:General  Level of Consciousness: awake and alert   Airway and Oxygen Therapy: Patient Spontanous Breathing  Post-op Pain: none  Post-op Assessment: Post-op Vital signs reviewed, Patient's Cardiovascular Status Stable and Respiratory Function Stable  Post-op Vital Signs: Reviewed  Filed Vitals:   08/19/14 1036  BP: 115/71  Pulse: 60  Temp:   Resp: 12    Complications: No apparent anesthesia complications

## 2014-08-19 NOTE — Transfer of Care (Signed)
Immediate Anesthesia Transfer of Care Note  Patient: Jacob Rhodes  Procedure(s) Performed: Procedure(s): cervical three -four anterior cervical decompression fusion with peek and plate (N/A)  Patient Location: PACU  Anesthesia Type:General  Level of Consciousness: awake, alert  and oriented  Airway & Oxygen Therapy: Patient Spontanous Breathing and Patient connected to nasal cannula oxygen  Post-op Assessment: Report given to PACU RN, Post -op Vital signs reviewed and stable and Patient moving all extremities  Post vital signs: Reviewed and stable  Complications: No apparent anesthesia complications

## 2014-08-19 NOTE — Anesthesia Procedure Notes (Signed)
Procedure Name: Intubation Date/Time: 08/19/2014 7:42 AM Performed by: Orvilla FusATO, Mychele Seyller A Pre-anesthesia Checklist: Patient identified, Timeout performed, Emergency Drugs available, Suction available and Patient being monitored Patient Re-evaluated:Patient Re-evaluated prior to inductionOxygen Delivery Method: Circle system utilized Preoxygenation: Pre-oxygenation with 100% oxygen Intubation Type: IV induction Ventilation: Oral airway inserted - appropriate to patient size and Mask ventilation without difficulty Laryngoscope Size: Mac and 3 Grade View: Grade I Tube type: Oral Tube size: 7.5 mm Number of attempts: 1 Airway Equipment and Method: Stylet Placement Confirmation: ETT inserted through vocal cords under direct vision,  breath sounds checked- equal and bilateral and positive ETCO2 Secured at: 23 cm Tube secured with: Tape Dental Injury: Teeth and Oropharynx as per pre-operative assessment

## 2014-08-19 NOTE — Discharge Summary (Signed)
  Physician Discharge Summary  Patient ID: Jacob Rhodes MRN: [id number] DOB/AGE: [birth year] 43 y.o.  Admit date: 08/19/2014 Discharge date: 08/19/2014  Admission Diagnoses: Cervical spondylosis with stenosis C3-4  Discharge Diagnoses: Same Active Problems:   Spinal stenosis of cervical region   Discharged Condition: good  Hospital Course: Patient admitted hospital underwent an ACDF at C3-4 postoperative probe very well went to the recovery in the floor was angling and voiding spontaneously and ready for discharge on the floor.  Consults: Significant Diagnostic Studies: Treatments: ACDF C3-4 Discharge Exam: Blood pressure 113/67, pulse 54, temperature 97.9 F (36.6 C), temperature source Oral, resp. rate 18, height 6' (1.829 m), weight 76.658 kg (169 lb), SpO2 98 %. Strength out of 5 wound clean dry and intact  Disposition: Home     Medication List    TAKE these medications        gabapentin 800 MG tablet  Commonly known as:  NEURONTIN  Take 800 mg by mouth 3 (three) times daily. Take in addition to 300 mg to equal 1100 mg     gabapentin 300 MG capsule  Commonly known as:  NEURONTIN  Take 300 mg by mouth 3 (three) times daily. Take in addition to 800 mg to equal 1100 mg     traMADol 50 MG tablet  Commonly known as:  ULTRAM  Take 50 mg by mouth 4 (four) times daily.           Follow-up Information    [facility]e Yurick P, MD.   Specialty:  Neurosurgery   Contact information:   [address] [place] [id number] [phone]       Signed: Rayhaan Huster P 08/19/2014, 3:07 PM

## 2014-08-19 NOTE — Discharge Instructions (Signed)
Wound Care  Keep the incision clean and dry remove the outer dressing in 2 days, leave the Steri-Strips intact.  Do not put any creams, lotions, or ointments on incision. Leave steri-strips on neck.  They will fall off by themselves.  Activity Walk each and every day, increasing distance each day. No lifting greater than 5 lbs.  Avoid excessive neck motion. No lifting no bending no twisting no driving or riding a car unless coming back and forth to see me. Wear neck brace at all times except when showering.   Diet Resume your normal diet.   Return to Work Will be discussed at you follow up appointment.  Call Your Doctor If Any of These Occur Redness, drainage, or swelling at the wound.  Temperature greater than 101 degrees. Severe pain not relieved by pain medication. Incision starts to come apart.  Follow Up Appt Call today for appointment in 1-2 weeks ([phone]) or for problems.  If you have any hardware placed in your spine, you will need an x-ray before your appointment.   

## 2014-08-19 NOTE — Plan of Care (Signed)
Problem: Consults Goal: Spinal Surgery Patient Education See Patient Education Module for education specifics. Outcome: Completed/Met Date Met:  08/19/14

## 2014-08-19 NOTE — Plan of Care (Signed)
Problem: Consults Goal: Diagnosis - Spinal Surgery Outcome: Completed/Met Date Met:  08/19/14 Cervical Spine Fusion

## 2014-08-19 NOTE — Anesthesia Preprocedure Evaluation (Addendum)
Anesthesia Evaluation  Patient identified by MRN, date of birth, ID band Patient awake    Reviewed: Allergy & Precautions, H&P , NPO status , Patient's Chart, lab work & pertinent test results  Airway Mallampati: II  TM Distance: >3 FB Neck ROM: Full    Dental no notable dental hx. (+) Teeth Intact, Dental Advisory Given, Chipped,    Pulmonary Current Smoker,  breath sounds clear to auscultation  Pulmonary exam normal       Cardiovascular negative cardio ROS  Rhythm:Regular Rate:Normal     Neuro/Psych negative psych ROS   GI/Hepatic negative GI ROS, Neg liver ROS,   Endo/Other  negative endocrine ROS  Renal/GU negative Renal ROS  negative genitourinary   Musculoskeletal   Abdominal   Peds  Hematology negative hematology ROS (+)   Anesthesia Other Findings   Reproductive/Obstetrics negative OB ROS                            Anesthesia Physical Anesthesia Plan  ASA: II  Anesthesia Plan: General   Post-op Pain Management:    Induction: Intravenous  Airway Management Planned: Oral ETT  Additional Equipment:   Intra-op Plan:   Post-operative Plan: Extubation in OR  Informed Consent: I have reviewed the patients History and Physical, chart, labs and discussed the procedure including the risks, benefits and alternatives for the proposed anesthesia with the patient or authorized representative who has indicated his/her understanding and acceptance.   Dental advisory given  Plan Discussed with: CRNA  Anesthesia Plan Comments:         Anesthesia Quick Evaluation

## 2014-08-20 ENCOUNTER — Encounter (HOSPITAL_COMMUNITY): Payer: Self-pay | Admitting: Neurosurgery

## 2014-09-20 ENCOUNTER — Other Ambulatory Visit (HOSPITAL_BASED_OUTPATIENT_CLINIC_OR_DEPARTMENT_OTHER): Payer: Self-pay | Admitting: Neurosurgery

## 2014-09-20 ENCOUNTER — Ambulatory Visit (HOSPITAL_BASED_OUTPATIENT_CLINIC_OR_DEPARTMENT_OTHER)
Admission: RE | Admit: 2014-09-20 | Discharge: 2014-09-20 | Disposition: A | Discharge status: Home / Self Care | Payer: Medicaid Other | Source: Ambulatory Visit | Attending: Neurosurgery | Admitting: Neurosurgery

## 2014-09-20 DIAGNOSIS — M542 Cervicalgia: Secondary | ICD-10-CM

## 2014-09-20 DIAGNOSIS — Z981 Arthrodesis status: Secondary | ICD-10-CM | POA: Insufficient documentation

## 2014-11-01 ENCOUNTER — Ambulatory Visit (HOSPITAL_BASED_OUTPATIENT_CLINIC_OR_DEPARTMENT_OTHER)
Admission: RE | Admit: 2014-11-01 | Discharge: 2014-11-01 | Disposition: A | Discharge status: Home / Self Care | Payer: Medicaid Other | Source: Ambulatory Visit | Attending: Neurosurgery | Admitting: Neurosurgery

## 2014-11-01 ENCOUNTER — Other Ambulatory Visit (HOSPITAL_BASED_OUTPATIENT_CLINIC_OR_DEPARTMENT_OTHER): Payer: Self-pay | Admitting: Neurosurgery

## 2014-11-01 DIAGNOSIS — M542 Cervicalgia: Secondary | ICD-10-CM | POA: Diagnosis not present

## 2014-11-01 DIAGNOSIS — M5021 Other cervical disc displacement,  high cervical region: Secondary | ICD-10-CM | POA: Diagnosis present

## 2016-01-17 ENCOUNTER — Emergency Department (HOSPITAL_BASED_OUTPATIENT_CLINIC_OR_DEPARTMENT_OTHER)
Admission: EM | Admit: 2016-01-17 | Discharge: 2016-01-17 | Disposition: A | Discharge status: Home / Self Care | Payer: Medicaid Other | Attending: Emergency Medicine | Admitting: Emergency Medicine

## 2016-01-17 ENCOUNTER — Emergency Department (HOSPITAL_BASED_OUTPATIENT_CLINIC_OR_DEPARTMENT_OTHER): Payer: Medicaid Other

## 2016-01-17 ENCOUNTER — Encounter (HOSPITAL_BASED_OUTPATIENT_CLINIC_OR_DEPARTMENT_OTHER): Payer: Self-pay | Admitting: Emergency Medicine

## 2016-01-17 DIAGNOSIS — M25562 Pain in left knee: Secondary | ICD-10-CM

## 2016-01-17 DIAGNOSIS — R52 Pain, unspecified: Secondary | ICD-10-CM

## 2016-01-17 DIAGNOSIS — F17290 Nicotine dependence, other tobacco product, uncomplicated: Secondary | ICD-10-CM | POA: Diagnosis not present

## 2016-01-17 HISTORY — DX: Mononeuropathy, unspecified: G58.9

## 2016-01-17 NOTE — ED Notes (Signed)
Pain and swelling to left knee that started 3 days ago without known injury.  Sts it is OK in the mornings when he gets up but gets worse throughout the day.  Climbs ladders at work. No hx of problems with this knee.

## 2016-01-17 NOTE — Discharge Instructions (Signed)
The x-ray of your left knee shows mild arthritis, but otherwise looks well. you can continue to use heat or ice therapy over your knee, take Motrin and Tylenol as needed for pain control, and keep knee elevated at rest. Continue light duty at work and at home. Return for worsening symptoms including inability to walk, new numbness or weakness, fevers, or any other symptoms concerning to you.  Joint Pain Joint pain, which is also called arthralgia, can be caused by many things. Joint pain often goes away when you follow your health care provider's instructions for relieving pain at home. However, joint pain can also be caused by conditions that require further treatment. Common causes of joint pain include:  Bruising in the area of the joint.  Overuse of the joint.  Wear and tear on the joints that occur with aging (osteoarthritis).  Various other forms of arthritis.  A buildup of a crystal form of uric acid in the joint (gout).  Infections of the joint (septic arthritis) or of the bone (osteomyelitis). Your health care provider may recommend medicine to help with the pain. If your joint pain continues, additional tests may be needed to diagnose your condition. HOME CARE INSTRUCTIONS Watch your condition for any changes. Follow these instructions as directed to lessen the pain that you are feeling.  Take medicines only as directed by your health care provider.  Rest the affected area for as long as your health care provider says that you should. If directed to do so, raise the painful joint above the level of your heart while you are sitting or lying down.  Do not do things that cause or worsen pain.  If directed, apply ice to the painful area:  Put ice in a plastic bag.  Place a towel between your skin and the bag.  Leave the ice on for 20 minutes, 2-3 times per day.  Wear an elastic bandage, splint, or sling as directed by your health care provider. Loosen the elastic bandage or  splint if your fingers or toes become numb and tingle, or if they turn cold and blue.  Begin exercising or stretching the affected area as directed by your health care provider. Ask your health care provider what types of exercise are safe for you.  Keep all follow-up visits as directed by your health care provider. This is important. SEEK MEDICAL CARE IF:  Your pain increases, and medicine does not help.  Your joint pain does not improve within 3 days.  You have increased bruising or swelling.  You have a fever.  You lose 10 lb (4.5 kg) or more without trying. SEEK IMMEDIATE MEDICAL CARE IF:  You are not able to move the joint.  Your fingers or toes become numb or they turn cold and blue.   This information is not intended to replace advice given to you by your health care provider. Make sure you discuss any questions you have with your health care provider.   Document Released: 08/30/2005 Document Revised: 09/20/2014 Document Reviewed: 06/11/2014 Elsevier Interactive Patient Education [employer]2016 [employer]

## 2016-01-17 NOTE — ED Provider Notes (Signed)
CSN: [id number]     Arrival date & time 01/17/16  1209 History   First MD Initiated Contact with Patient 01/17/16 1222     Chief Complaint  Patient presents with  . Knee Pain     (Consider location/radiation/quality/duration/timing/severity/associated sxs/prior Treatment) HPI 45 year old male who presents with left knee pain. He has a history of anxiety. States that over the past 3 days he has been having intermittent left knee pain and swelling. 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 He states that he is also very active at home with 3 children and multiple pit bulls. Notes that pain and swelling significantly improved in the mornings but throughout the day he has some knee swelling and pain especially with ambulation and movement. No trauma. No fevers or chills. No numbness or weakness.   Past Medical History  Diagnosis Date  . Anxiety   . Shortness of breath dyspnea     anxiety related  . Headache   . Pinched nerve    Past Surgical History  Procedure Laterality Date  . Plyoric stenosis surgery as infant    . Anterior cervical decomp/discectomy fusion N/A 08/19/2014    Procedure: cervical three -four anterior cervical decompression fusion with peek and plate;  Surgeon: Mariam Dollar, MD;  Location: [facility];  Service: Neurosurgery;  Laterality: N/A;   History reviewed. No pertinent family history. Social History  Substance Use Topics  . Smoking status: Current Every Day Smoker    Types: Cigars  . Smokeless tobacco: None  . Alcohol Use: No    Review of Systems 10/14 systems reviewed and are negative other than those stated in the HPI    Allergies  Review of patient's allergies indicates no known allergies.  Home Medications   Prior to Admission medications   Medication Sig Start Date End Date Taking? Authorizing Provider  gabapentin (NEURONTIN) 300 MG capsule Take 300 mg by mouth 3 (three) times daily. Take in addition to 800 mg to equal 1100 mg  07/31/14   Historical Provider, MD  gabapentin (NEURONTIN) 800 MG tablet Take 800 mg by mouth 3 (three) times daily. Take in addition to 300 mg to equal 1100 mg 07/30/14   Historical Provider, MD  traMADol (ULTRAM) 50 MG tablet Take 50 mg by mouth 4 (four) times daily. 07/17/14   Historical Provider, MD   BP 129/81 mmHg  Pulse 67  Temp(Src) 99.3 F (37.4 C) (Oral)  Resp 16  Ht 6' (1.829 m)  Wt 180 lb (81.647 kg)  BMI 24.41 kg/m2  SpO2 100% Physical Exam Physical Exam  Nursing note and vitals reviewed. Constitutional: Well developed, well nourished, non-toxic, and in no acute distress Head: Normocephalic and atraumatic.  Mouth/Throat: Oropharynx is clear and moist.  Neck: Normal range of motion. Neck supple.  Cardiovascular: Normal rate and regular rhythm.   +2 DP pulses bilaterally Pulmonary/Chest: Effort normal and breath sounds normal.  Abdominal: Soft. There is no tenderness. There is no rebound and no guarding.  Musculoskeletal: No deformity, swelling, or overlying skin changes involving the left knee. He has full range of motion although does report some pain with full flexion. Able to ambulate but reports pain with ambulation.  Neurological: Alert, no facial droop, fluent speech, moves all extremities symmetrically, sensation to light touch intact throughout Skin: Skin is warm and dry.  Psychiatric: Cooperative\  ED Course  Procedures (including critical care time) Labs Review Labs Reviewed - No data to display  Imaging Review  Dg Knee Complete 4 Views Left  01/17/2016  CLINICAL DATA:  Pain for 4 days EXAM: LEFT KNEE - COMPLETE 4+ VIEW COMPARISON:  None. FINDINGS: Standing frontal, standing tunnel, standing lateral, and sunrise patellar images were obtained. There is no fracture or dislocation. No joint effusion. There is no appreciable joint space narrowing. There is minimal spurring medially and in the intracondylar region. No erosive change. IMPRESSION: Minimal osteoarthritic  change.  No fracture or joint effusion. Electronically Signed   By: Bretta BangWilliam  Woodruff III M.D.   On: 01/17/2016 13:01   I have personally reviewed and evaluated these images and lab results as part of my medical decision-making.   EKG Interpretation None      MDM   Final diagnoses:  Left knee pain    45 year old male who presents with left knee pain over the past 2-3 days. On presentation he is nontoxic in no acute distress. Vital signs are within normal limits. External exam of the knee does not reveal any swelling, deformity, or overlying skin changes. X-ray reveals mild arthritis but no bony deformities or significant effusion. No concern for septic arthritis or other serious injury. Presentation seems consistent with that of likely overuse injury. Discussed supportive care with anti-inflammatory medications, icing or heat packs, and compression dressing with elevation and rest. Strict return and follow-up instructions are reviewed. He expressed understanding of all discharge instructions, and felt comfortable with the plan of care.    Lavera Guiseana Duo Meiko Ives, MD 01/17/16 [phone]

## 2016-02-16 ENCOUNTER — Encounter (HOSPITAL_BASED_OUTPATIENT_CLINIC_OR_DEPARTMENT_OTHER): Payer: Self-pay | Admitting: *Deleted

## 2016-02-16 ENCOUNTER — Emergency Department (HOSPITAL_BASED_OUTPATIENT_CLINIC_OR_DEPARTMENT_OTHER)
Admission: EM | Admit: 2016-02-16 | Discharge: 2016-02-16 | Disposition: A | Discharge status: Home / Self Care | Payer: Medicaid Other | Attending: Emergency Medicine | Admitting: Emergency Medicine

## 2016-02-16 DIAGNOSIS — K0889 Other specified disorders of teeth and supporting structures: Secondary | ICD-10-CM | POA: Insufficient documentation

## 2016-02-16 DIAGNOSIS — F17210 Nicotine dependence, cigarettes, uncomplicated: Secondary | ICD-10-CM | POA: Insufficient documentation

## 2016-02-16 MED ORDER — PENICILLIN V POTASSIUM 500 MG PO TABS: 500.0000 mg | ORAL_TABLET | Freq: Four times a day (QID) | ORAL | Status: AC

## 2016-02-16 NOTE — Discharge Instructions (Signed)
You have a dental injury. It is very important that you get evaluated by a dentist as soon as possible. Call today or tomorrow to schedule an appointment. Ibuprofen as needed for pain. Take your full course of antibiotics. Read the instructions below.  Eat a soft or liquid diet and rinse your mouth out after meals with warm water. You should see a dentist or return here at once if you have increased swelling, increased pain or uncontrolled bleeding from the site of your injury.  SEEK MEDICAL CARE IF:   You have increased pain not controlled with medicines.   You have swelling around your tooth, in your face or neck.   You have bleeding which starts, continues, or gets worse.   You have a fever >101  If you are unable to open your mouth

## 2016-02-16 NOTE — ED Notes (Signed)
Toothache x 2 days 

## 2016-02-16 NOTE — ED Provider Notes (Signed)
CSN: [id number]     Arrival date & time 02/16/16  1221 History   First MD Initiated Contact with Patient 02/16/16 1227     Chief Complaint  Patient presents with  . Dental Pain    (Consider location/radiation/quality/duration/timing/severity/associated sxs/prior Treatment) Patient is a 45 y.o. male presenting with tooth pain.  Dental Pain Associated symptoms: no facial swelling and no fever      Jacob Rhodes is a 45 y.o. male  who presents to the Emergency Department complaining of worsening aching right lower dental pain x 2 days. Taken ibuprofen and tramadol with some relief. Pain feels like it is radiating to right ear at times. Denies difficulty breathing or swallowing. Denies fever/chills.     Past Medical History  Diagnosis Date  . Anxiety   . Shortness of breath dyspnea     anxiety related  . Headache   . Pinched nerve    Past Surgical History  Procedure Laterality Date  . Plyoric stenosis surgery as infant    . Anterior cervical decomp/discectomy fusion N/A 08/19/2014    Procedure: cervical three -four anterior cervical decompression fusion with peek and plate;  Surgeon: Mariam DollarGary P Cram, MD;  Location: [facility];  Service: Neurosurgery;  Laterality: N/A;   No family history on file. Social History  Substance Use Topics  . Smoking status: Current Every Day Smoker    Types: Cigars  . Smokeless tobacco: None  . Alcohol Use: No    Review of Systems  Constitutional: Negative for fever.  HENT: Positive for dental problem. Negative for facial swelling and trouble swallowing.   Respiratory: Negative for shortness of breath.       Allergies  Review of patient's allergies indicates no known allergies.  Home Medications   Prior to Admission medications   Medication Sig Start Date End Date Taking? Authorizing Provider  gabapentin (NEURONTIN) 300 MG capsule Take 300 mg by mouth 3 (three) times daily. Take in addition to 800 mg to equal 1100 mg 07/31/14    Historical Provider, MD  gabapentin (NEURONTIN) 800 MG tablet Take 800 mg by mouth 3 (three) times daily. Take in addition to 300 mg to equal 1100 mg 07/30/14   Historical Provider, MD  penicillin v potassium (VEETID) 500 MG tablet Take 1 tablet (500 mg total) by mouth 4 (four) times daily. 02/16/16 02/23/16  Chase PicketJaime Pilcher Elvia Aydin, PA-C  traMADol (ULTRAM) 50 MG tablet Take 50 mg by mouth 4 (four) times daily. 07/17/14   Historical Provider, MD   BP 131/87 mmHg  Pulse 86  Temp(Src) 99.7 F (37.6 C) (Oral)  Resp 18  Ht 6' (1.829 m)  Wt 81.647 kg  BMI 24.41 kg/m2  SpO2 97% Physical Exam  Constitutional: He is oriented to person, place, and time. He appears well-developed and well-nourished.  Alert and in no acute distress  HENT:  Head: Normocephalic and atraumatic.  Mouth/Throat:    Dental cavities and poor oral dentition noted, pain along tooth as depicted in image, midline uvula, no trismus, oropharynx moist and clear, no abscess noted, no oropharyngeal erythema or edema, neck supple and no tenderness. No facial edema  Cardiovascular: Normal rate, regular rhythm, normal heart sounds and intact distal pulses.  Exam reveals no gallop and no friction rub.   No murmur heard. Pulmonary/Chest: Effort normal and breath sounds normal. No respiratory distress. He has no wheezes. He has no rales. He exhibits no tenderness.  Neurological: He is alert and oriented to person, place, and time.  Skin:  Skin is warm and dry.  Nursing note and vitals reviewed.   ED Course  Procedures (including critical care time) Labs Review Labs Reviewed - No data to display  Imaging Review No results found. I have personally reviewed and evaluated these images and lab results as part of my medical decision-making.   EKG Interpretation None      MDM   Final diagnoses:  Dentalgia   Patient with dentalgia. No abscess requiring immediate incision and drainage. Patient is afebrile, non toxic appearing, and  swallowing secretions well. Exam not concerning for Ludwig's angina or pharyngeal abscess. Will treat with PenVK. Patient states he called dentist who sent him to ER saying he could not do anything until he had completed ABX. I stressed the importance of dental follow up for ultimate management of dental pain. Patient voices understanding and is agreeable to plan.   [facility] Jacob Gratz, PA-C 02/16/16 1306  Linwood Dibbles, MD 02/16/16 [phone]

## 2016-04-18 IMAGING — CR DG CERVICAL SPINE 2 OR 3 VIEWS
2 series · 2 of 2 positions shown · non-contrast
Comparison: 08/19/2014.  07/04/2014.

CLINICAL DATA: Subsequent encounter for cervical fusion and neck
pain

EXAM:
CERVICAL SPINE - 2-3 VIEW

[w c-spine lat]
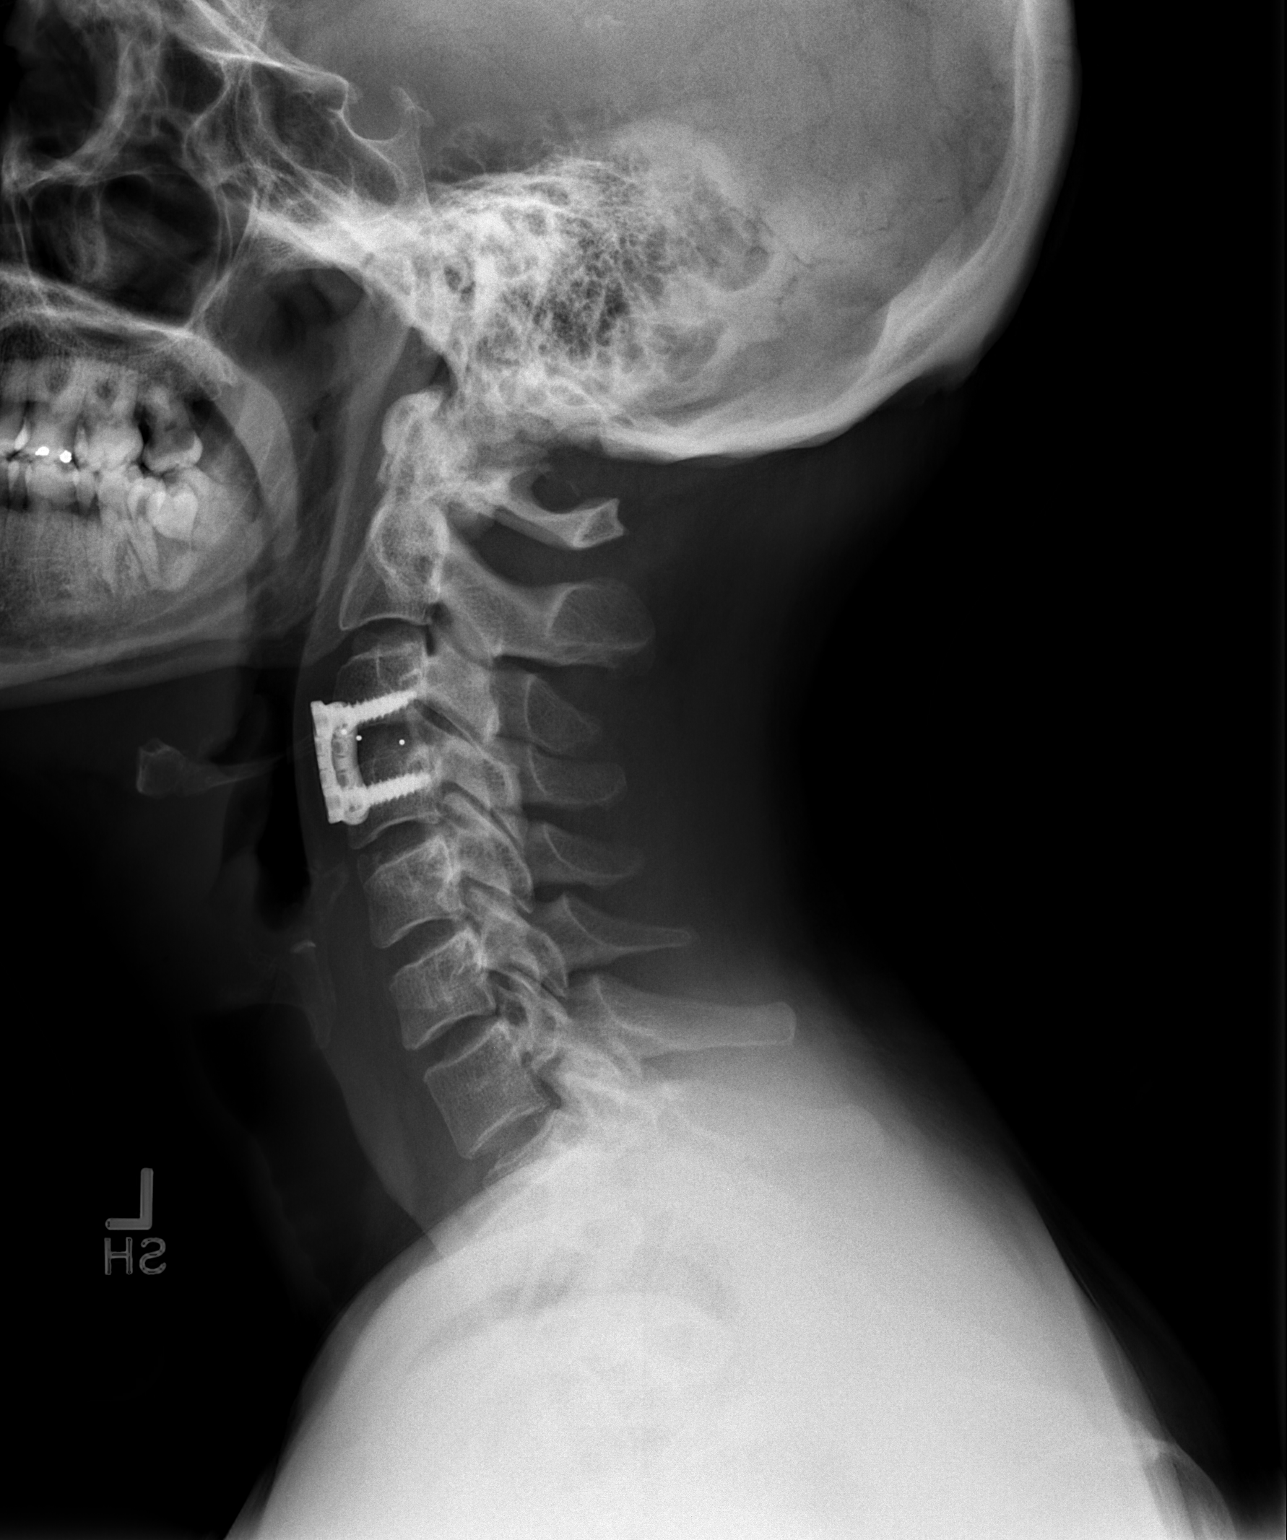

[w c-spine a.p.]
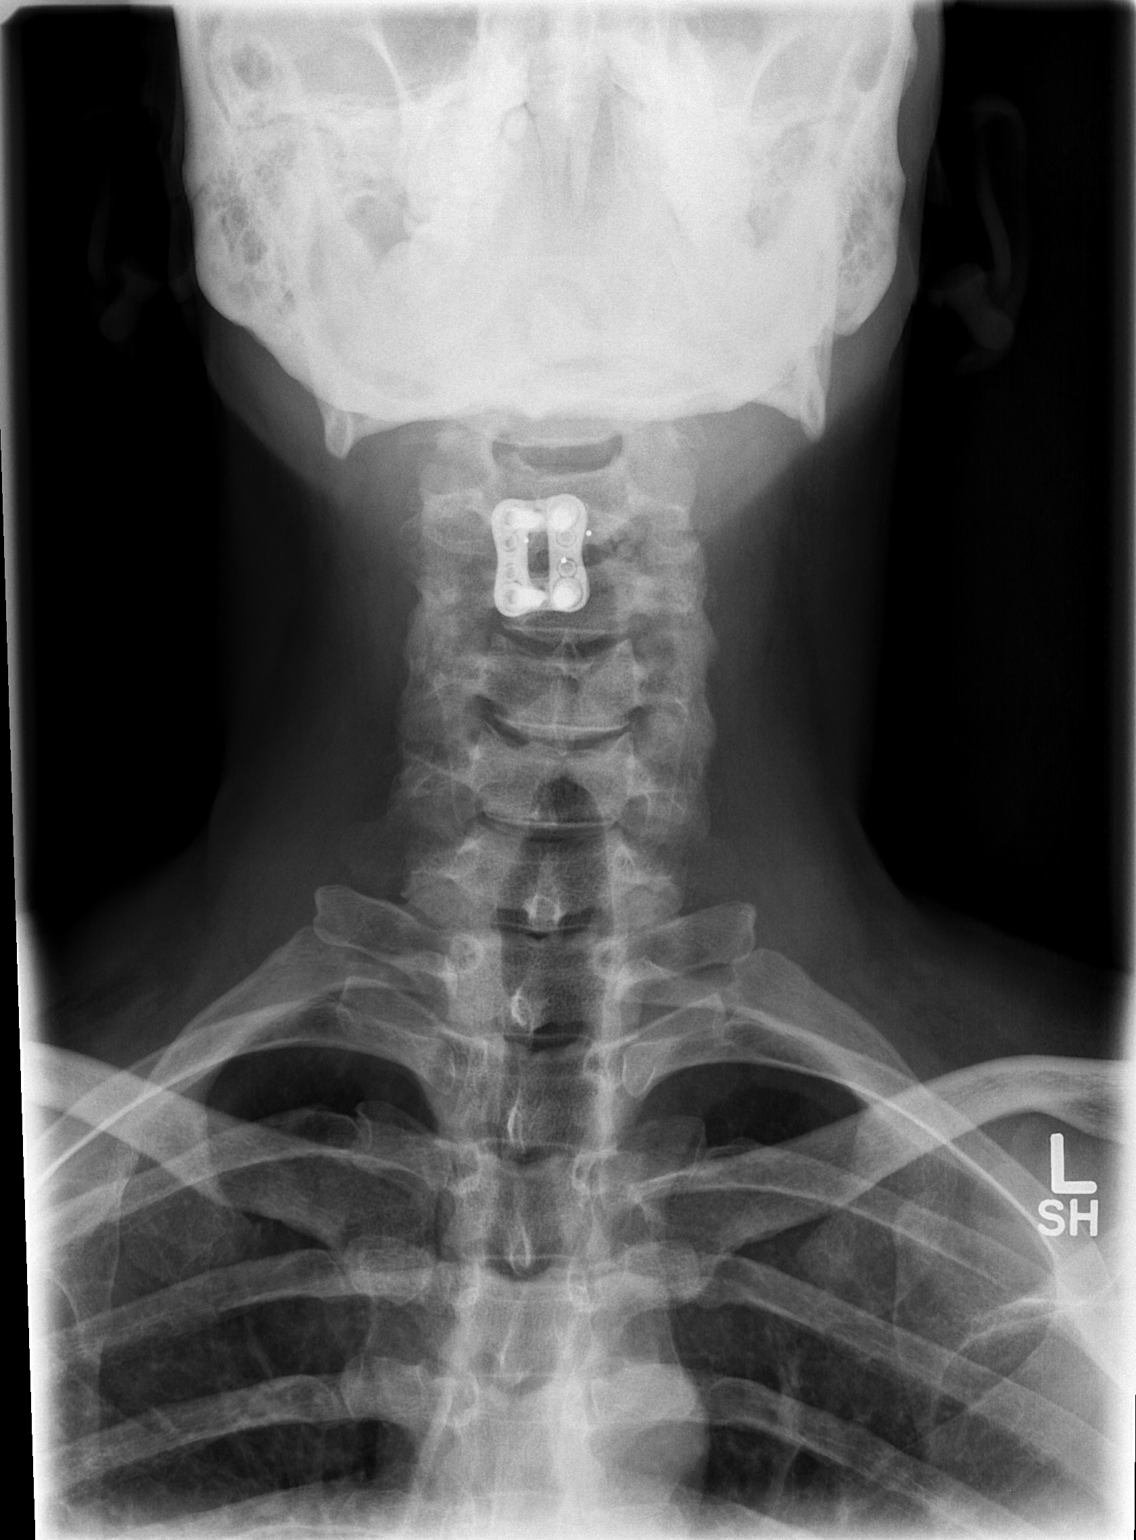

[2 of 2 positions shown; findings below may reference images not displayed]

FINDINGS: Two view exam of the cervical spine shows the patient to be ACDF
with anterior plating at C3-4. Compared to the intraoperative spot
fluoro films, interbody spacing and position of the anterior plate
appears stable. No evidence for subluxation. No lucency around the
vertebral body screws. No prevertebral soft tissue swelling.
IMPRESSION: Expected postoperative appearance in this patient status post ACDF
with plating at C3-4.

## 2016-05-30 IMAGING — DX DG CERVICAL SPINE 2 OR 3 VIEWS
2 series · 2 of 2 positions shown · non-contrast
Comparison: 09/20/2014

CLINICAL DATA: Displacement of intervertebral disc.  Cervicalgia.

EXAM:
CERVICAL SPINE - 2-3 VIEW

[c-spine ap]
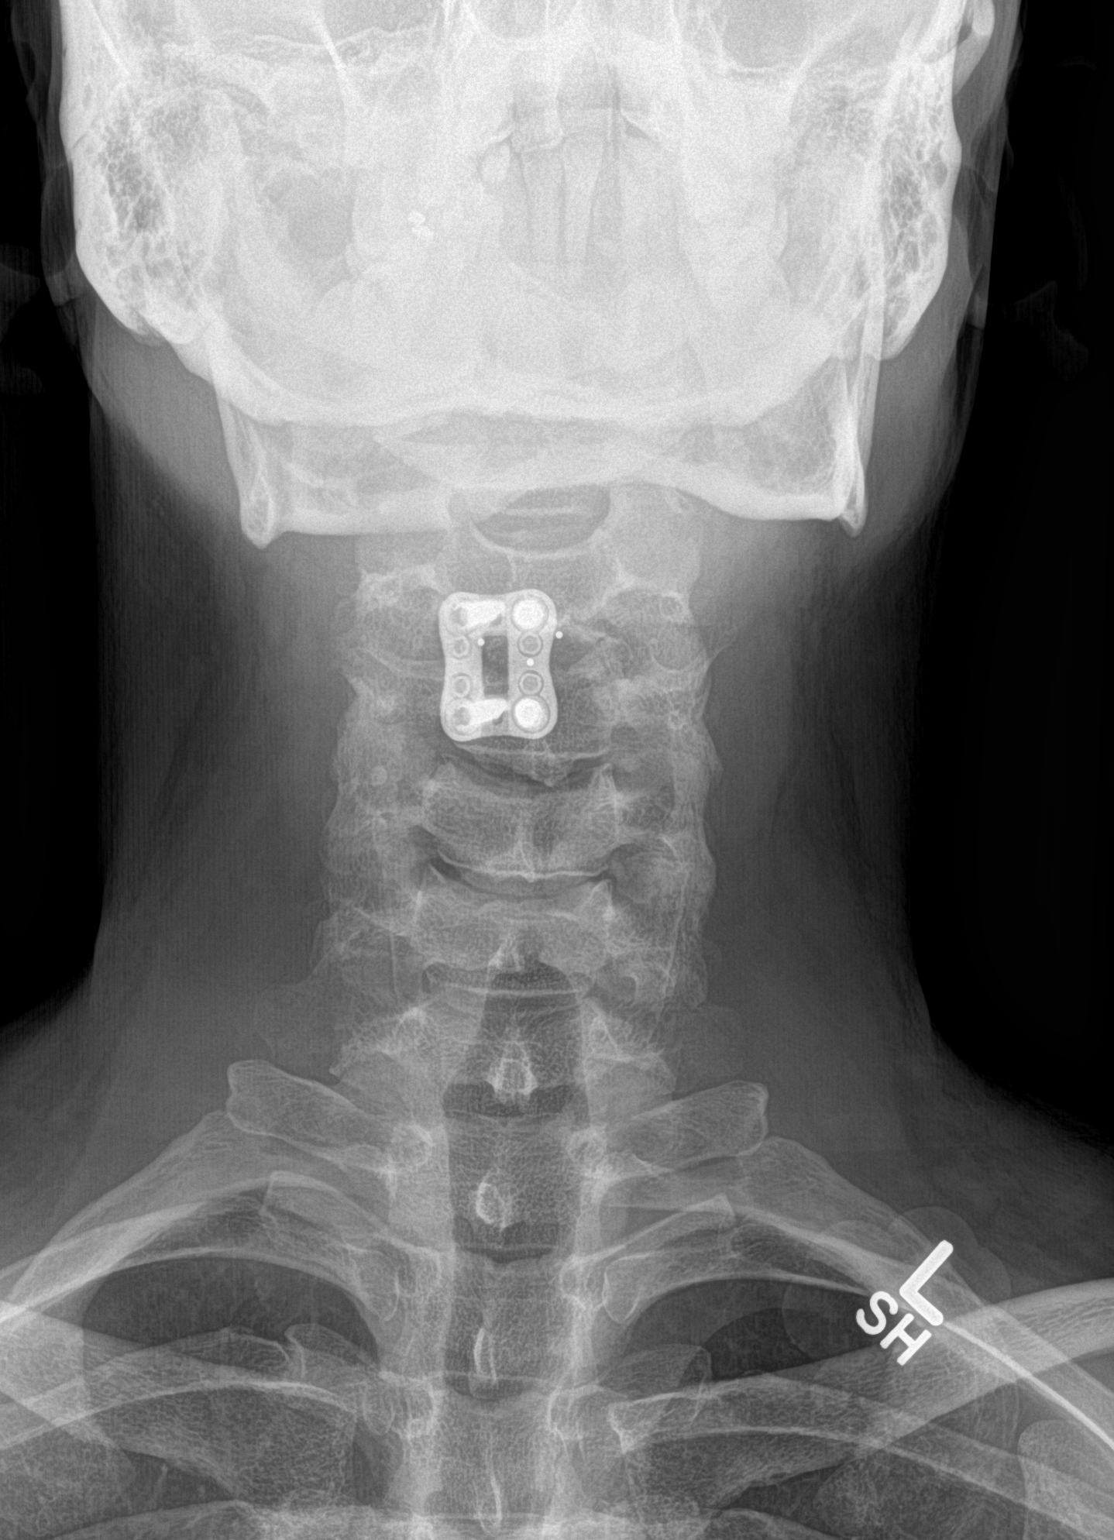

[c-spine lat]
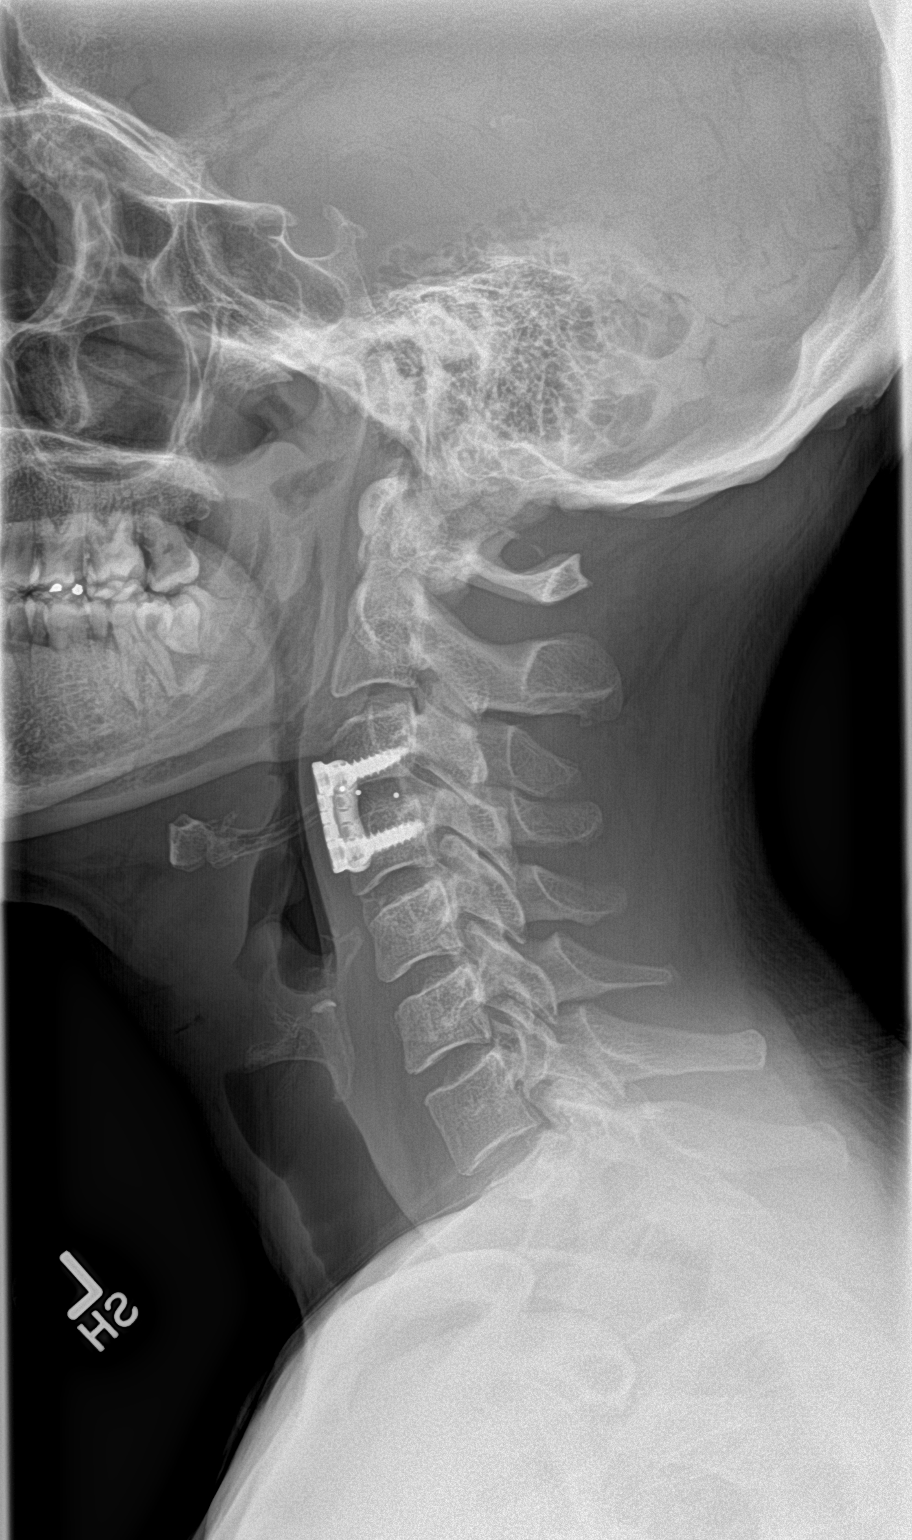

[2 of 2 positions shown; findings below may reference images not displayed]

FINDINGS: There has been previous anterior cervical discectomy and fusion at
C3-4. The appearance is good. Other disc levels appear normal. No
unexpected soft tissue swelling.
IMPRESSION: Previous ACDF C3-4. Unchanged and good appearance. No complicating
feature identified.

## 2022-08-18 DIAGNOSIS — M79671 Pain in right foot: Secondary | ICD-10-CM | POA: Diagnosis not present

## 2023-09-12 ENCOUNTER — Ambulatory Visit (INDEPENDENT_AMBULATORY_CARE_PROVIDER_SITE_OTHER): Payer: Medicaid Other | Admitting: Urgent Care

## 2023-09-12 ENCOUNTER — Encounter: Payer: Self-pay | Admitting: Urgent Care

## 2023-09-12 VITALS — BP 147/95 | HR 72 | Ht 71.0 in | Wt 172.8 lb

## 2023-09-12 DIAGNOSIS — F5104 Psychophysiologic insomnia: Secondary | ICD-10-CM

## 2023-09-12 DIAGNOSIS — Z114 Encounter for screening for human immunodeficiency virus [HIV]: Secondary | ICD-10-CM | POA: Diagnosis not present

## 2023-09-12 DIAGNOSIS — Z125 Encounter for screening for malignant neoplasm of prostate: Secondary | ICD-10-CM | POA: Diagnosis not present

## 2023-09-12 DIAGNOSIS — Z1159 Encounter for screening for other viral diseases: Secondary | ICD-10-CM | POA: Diagnosis not present

## 2023-09-12 DIAGNOSIS — Z131 Encounter for screening for diabetes mellitus: Secondary | ICD-10-CM

## 2023-09-12 DIAGNOSIS — Z9189 Other specified personal risk factors, not elsewhere classified: Secondary | ICD-10-CM

## 2023-09-12 DIAGNOSIS — R0789 Other chest pain: Secondary | ICD-10-CM | POA: Insufficient documentation

## 2023-09-12 DIAGNOSIS — Z79899 Other long term (current) drug therapy: Secondary | ICD-10-CM

## 2023-09-12 DIAGNOSIS — R61 Generalized hyperhidrosis: Secondary | ICD-10-CM | POA: Diagnosis not present

## 2023-09-12 DIAGNOSIS — Z13220 Encounter for screening for lipoid disorders: Secondary | ICD-10-CM | POA: Diagnosis not present

## 2023-09-12 DIAGNOSIS — F17200 Nicotine dependence, unspecified, uncomplicated: Secondary | ICD-10-CM

## 2023-09-12 DIAGNOSIS — F419 Anxiety disorder, unspecified: Secondary | ICD-10-CM

## 2023-09-12 DIAGNOSIS — Z1329 Encounter for screening for other suspected endocrine disorder: Secondary | ICD-10-CM

## 2023-09-12 DIAGNOSIS — R634 Abnormal weight loss: Secondary | ICD-10-CM

## 2023-09-12 DIAGNOSIS — R251 Tremor, unspecified: Secondary | ICD-10-CM

## 2023-09-12 LAB — COMPREHENSIVE METABOLIC PANEL
ALT: 15 U/L (ref 0–53)
AST: 20 U/L (ref 0–37)
Albumin: 4.8 g/dL (ref 3.5–5.2)
Alkaline Phosphatase: 66 U/L (ref 39–117)
BUN: 15 mg/dL (ref 6–23)
CO2: 28 meq/L (ref 19–32)
Calcium: 9.6 mg/dL (ref 8.4–10.5)
Chloride: 102 meq/L (ref 96–112)
Creatinine, Ser: 0.88 mg/dL (ref 0.40–1.50)
GFR: 99.25 mL/min (ref >60.00)
Glucose, Bld: 100 mg/dL — ABNORMAL HIGH (ref 70–99)
Potassium: 4.2 meq/L (ref 3.5–5.1)
Sodium: 139 meq/L (ref 135–145)
Total Bilirubin: 0.5 mg/dL (ref 0.2–1.2)
Total Protein: 7.2 g/dL (ref 6.0–8.3)

## 2023-09-12 LAB — CBC
HCT: 44 % (ref 39.0–52.0)
Hemoglobin: 15 g/dL (ref 13.0–17.0)
MCHC: 34.1 g/dL (ref 30.0–36.0)
MCV: 92.4 fL (ref 78.0–100.0)
Platelets: 220 10*3/uL (ref 150.0–400.0)
RBC: 4.76 Mil/uL (ref 4.22–5.81)
RDW: 13.2 % (ref 11.5–15.5)
WBC: 5.6 10*3/uL (ref 4.0–10.5)

## 2023-09-12 LAB — LIPID PANEL
Cholesterol: 158 mg/dL (ref 0–200)
HDL: 52.8 mg/dL (ref >39.00)
LDL Cholesterol: 87 mg/dL (ref 0–99)
NonHDL: 104.79
Total CHOL/HDL Ratio: 3
Triglycerides: 87 mg/dL (ref 0.0–149.0)
VLDL: 17.4 mg/dL (ref 0.0–40.0)

## 2023-09-12 LAB — PSA: PSA: 0.5 ng/mL (ref 0.10–4.00)

## 2023-09-12 LAB — HEMOGLOBIN A1C: Hgb A1c MFr Bld: 5.6 % (ref 4.6–6.5)

## 2023-09-12 LAB — TSH: TSH: 1.41 u[IU]/mL (ref 0.35–5.50)

## 2023-09-12 MED ORDER — HYDROXYZINE PAMOATE 50 MG PO CAPS: 50.0000 mg | ORAL_CAPSULE | Freq: Every evening | ORAL | 5 refills | Status: DC | PRN

## 2023-09-12 MED ORDER — BUSPIRONE HCL 10 MG PO TABS: ORAL_TABLET | ORAL | 5 refills | Status: DC

## 2023-09-12 MED ORDER — PROPRANOLOL HCL 40 MG PO TABS: 40.0000 mg | ORAL_TABLET | Freq: Two times a day (BID) | ORAL | 5 refills | Status: DC

## 2023-09-12 NOTE — Assessment & Plan Note (Signed)
Will consider low dose CT scan chest in near future

## 2023-09-12 NOTE — Assessment & Plan Note (Signed)
GAD-7 score: 21 PHQ-9 score: 25  Pt with severe anxiety; discussed possible inpatient treatment. He states he does not feel this is necessary, has no thoughts of self harm or harming others and has been dealing with this for years. Additionally, has fear of losing his job if he does not work.  Will start outpatient treatment with short term follow up (1 week from now) Behavioral health urgent care information given to patient.

## 2023-09-12 NOTE — Patient Instructions (Addendum)
Please start taking buspirone as prescribed per taper pack directions. Take 1/2 tab once daily x 3 days, then 1/2 tab twice daily x 3 days, then 1 tab in AM, 1/2 tab in PM x 3 days, then continue with a full tab twice daily.  Take propranolol twice daily.  Take the hydroxyzine once nightly before bed to help you sleep. You can also take 10mg  melatonin.   Please return to see me again in one week for a follow up, sooner if needed.   If at any point in time your anxiety becomes worse, or if you develop thoughts of self harm, please head to the behavioral health urgent care.  [address] [place], [place] [address] Hours: Open 24 hours Phone: [phone]   Please see the attached sheet for other therapy services.

## 2023-09-12 NOTE — Progress Notes (Signed)
New Patient Office Visit  Subjective:  Patient ID: Jacob Rhodes, male    DOB: 28-Mar-1971  Age: 52 y.o. MRN: [id number]  CC:  Chief Complaint  Patient presents with   Establish Care    New pt est. He has been having increased anxiety and depression and would like a referral to a therapist.    HPI Jacob Rhodes presents to establish care.  52yo male presents to establish care. States he has not had any routine healthcare since 2015 when he had a cervical spine surgery. 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 He was placed on pain medication s/p surgery and has now been on suboxone twice daily for the past three years.  His main concern today is severe anxiety. He reports having had these symptoms for several years, but states it has been worse in the past month. He has had a lot of loss in his family, with the most recent being his brother two weeks ago. This was sudden and unexpected. Pt denies any SI/HI/AVH. He states he is not overly depressed per se, has three children, a wife, and a new grandbaby. States he has a lot to live for, but feels on the verge of a mental breakdown from his anxiety. He reports between 2-4am nightly he has been waking up in a pool of sweat. He has lost 10# in the past several months. He also reports rather consistent chest pressure. He denies it as pain, and states it is constant for many years. Exertion does not make it worse.  Pt does have a hx of illicit drug use in the past- THC, cocaine, LSD, mushrooms, meth, prescription opiates, etc, but states the only substance he uses now is THC, and this is rather sporadic.  Pt does smoke cigarettes. He smokes roughly 5-6 cigarettes a day, which is decreased from previous. He has smoked since age 88.   Outpatient Encounter Medications as of 09/12/2023  Medication Sig   busPIRone (BUSPAR) 10 MG tablet Take 1/2 tab once daily x 3 days, then increase  to 1/2 tab twice daily x 3 days, then 1 tab in AM, 1/2 tab in PM x 3 days, then 1 tab twice daily   hydrOXYzine (VISTARIL) 50 MG capsule Take 1 capsule (50 mg total) by mouth at bedtime as needed for anxiety (insomnia).   propranolol (INDERAL) 40 MG tablet Take 1 tablet (40 mg total) by mouth 2 (two) times daily.   SUBOXONE 8-2 MG FILM Place under the tongue 2 (two) times daily.   [DISCONTINUED] gabapentin (NEURONTIN) 300 MG capsule Take 300 mg by mouth 3 (three) times daily. Take in addition to 800 mg to equal 1100 mg   [DISCONTINUED] gabapentin (NEURONTIN) 800 MG tablet Take 800 mg by mouth 3 (three) times daily. Take in addition to 300 mg to equal 1100 mg   [DISCONTINUED] traMADol (ULTRAM) 50 MG tablet Take 50 mg by mouth 4 (four) times daily.   No facility-administered encounter medications on file as of 09/12/2023.    Past Medical History:  Diagnosis Date   Anxiety    Depression    Headache    Pinched nerve    Shortness of breath dyspnea    anxiety related   Substance abuse [facility])     Past Surgical History:  Procedure Laterality Date   ANTERIOR CERVICAL DECOMP/DISCECTOMY FUSION N/A 08/19/2014   Procedure: cervical three -four anterior cervical decompression fusion with peek  and plate;  Surgeon: Mariam Dollar, MD;  Location: [facility];  Service: Neurosurgery;  Laterality: N/A;   plyoric stenosis surgery as infant      History reviewed. No pertinent family history.  Social History   Socioeconomic History   Marital status: Married    Spouse name: Not on file   Number of children: Not on file   Years of education: Not on file   Highest education level: 9th grade  Occupational History   Not on file  Tobacco Use   Smoking status: Every Day    Current packs/day: 0.25    Average packs/day: 0.3 packs/day for 15.0 years (3.8 ttl pk-yrs)    Types: Cigarettes, Cigars   Smokeless tobacco: Not on file  Substance and Sexual Activity   Alcohol use: No   Drug use: Not Currently     Types: Cocaine, Hydrocodone, Marijuana, Morphine, Oxycodone   Sexual activity: Yes  Other Topics Concern   Not on file  Social History Narrative   Not on file   Social Drivers of Health   Financial Resource Strain: Medium Risk (09/10/2023)   Overall Financial Resource Strain (CARDIA)    Difficulty of Paying Living Expenses: Somewhat hard  Food Insecurity: No Food Insecurity (09/10/2023)   Hunger Vital Sign    Worried About Running Out of Food in the Last Year: Never true    Ran Out of Food in the Last Year: Never true  Transportation Needs: No Transportation Needs (09/10/2023)   PRAPARE - Administrator, Civil Service (Medical): No    Lack of Transportation (Non-Medical): No  Physical Activity: Sufficiently Active (09/10/2023)   Exercise Vital Sign    Days of Exercise per Week: 5 days    Minutes of Exercise per Session: 150+ min  Stress: Stress Concern Present (09/10/2023)   [employer] of Occupational Health - Occupational Stress Questionnaire    Feeling of Stress : Very much  Social Connections: Moderately Integrated (09/10/2023)   Social Connection and Isolation Panel [NHANES]    Frequency of Communication with Friends and Family: More than three times a week    Frequency of Social Gatherings with Friends and Family: Three times a week    Attends Religious Services: 1 to 4 times per year    Active Member of Clubs or Organizations: No    Attends Engineer, structural: Not on file    Marital Status: Married  Catering manager Violence: Not on file    ROS: as noted in HPI  Objective:  BP (!) 147/95   Pulse 72   Ht 5\' 11"  (1.803 m)   Wt 172 lb 12.8 oz (78.4 kg)   SpO2 98%   BMI 24.10 kg/m   Physical Exam Vitals and nursing note reviewed.  Constitutional:      General: He is not in acute distress.    Appearance: Normal appearance. He is not ill-appearing, toxic-appearing or diaphoretic.  HENT:     Head: Normocephalic and atraumatic.      Mouth/Throat:     Mouth: Mucous membranes are moist.     Pharynx: No oropharyngeal exudate or posterior oropharyngeal erythema.  Eyes:     General: No scleral icterus.       Right eye: No discharge.        Left eye: No discharge.     Extraocular Movements: Extraocular movements intact.     Pupils: Pupils are equal, round, and reactive to light.  Cardiovascular:  Rate and Rhythm: Normal rate and regular rhythm.     Heart sounds: No murmur heard. Pulmonary:     Effort: Pulmonary effort is normal. No respiratory distress.     Breath sounds: Normal breath sounds. No stridor. No wheezing or rhonchi.  Musculoskeletal:     Cervical back: Normal range of motion and neck supple. No rigidity or tenderness.  Lymphadenopathy:     Cervical: No cervical adenopathy.  Skin:    General: Skin is warm and dry.     Comments: Many tattoos  Neurological:     Mental Status: He is alert.     Comments: Tremulous; tremor noted to hands  Psychiatric:        Attention and Perception: Attention and perception normal.        Mood and Affect: Mood is anxious.        Speech: Speech normal.        Thought Content: Thought content normal. Thought content is not paranoid or delusional. Thought content does not include homicidal or suicidal ideation. Thought content does not include homicidal or suicidal plan.     Last CBC Lab Results  Component Value Date   WBC 6.8 08/16/2014   HGB 15.3 08/16/2014   HCT 42.9 08/16/2014   MCV 89.7 08/16/2014   MCH 32.0 08/16/2014   RDW 12.8 08/16/2014   PLT 213 08/16/2014   Last metabolic panel Lab Results  Component Value Date   GLUCOSE 85 08/16/2014   NA 141 08/16/2014   K 4.7 08/16/2014   CL 100 08/16/2014   CO2 29 08/16/2014   BUN 23 08/16/2014   CREATININE 0.79 08/16/2014   GFRNONAA >90 08/16/2014   CALCIUM 9.9 08/16/2014   ANIONGAP 12 08/16/2014      Assessment & Plan:  Severe anxiety Assessment & Plan: GAD-7 score: 21 PHQ-9 score: 25  Pt with  severe anxiety; discussed possible inpatient treatment. He states he does not feel this is necessary, has no thoughts of self harm or harming others and has been dealing with this for years. Additionally, has fear of losing his job if he does not work.  Will start outpatient treatment with short term follow up (1 week from now) Behavioral health urgent care information given to patient.  Orders: -     CBC -     Comprehensive metabolic panel -     Hemoglobin A1c -     Lipid panel -     PSA -     TSH -     busPIRone HCl; Take 1/2 tab once daily x 3 days, then increase to 1/2 tab twice daily x 3 days, then 1 tab in AM, 1/2 tab in PM x 3 days, then 1 tab twice daily  Dispense: 60 tablet; Refill: 5 -     Propranolol HCl; Take 1 tablet (40 mg total) by mouth 2 (two) times daily.  Dispense: 60 tablet; Refill: 5 -     EKG 12-Lead -     hydrOXYzine Pamoate; Take 1 capsule (50 mg total) by mouth at bedtime as needed for anxiety (insomnia).  Dispense: 30 capsule; Refill: 5 -     Ambulatory referral to Psychology  Weight loss -     CBC -     Comprehensive metabolic panel -     Hemoglobin A1c -     TSH -     HIV Antibody (routine testing w rflx)  Night sweats -     CBC -  Comprehensive metabolic panel -     Hemoglobin A1c -     TSH -     HIV Antibody (routine testing w rflx)  Chest heaviness Assessment & Plan: Likely secondary to anxiety. EKG without any concerning findings.  Orders: -     CBC -     Comprehensive metabolic panel -     Hemoglobin A1c -     Lipid panel -     TSH -     Propranolol HCl; Take 1 tablet (40 mg total) by mouth 2 (two) times daily.  Dispense: 60 tablet; Refill: 5 -     EKG 12-Lead  Tremulousness Assessment & Plan: Likely secondary to anxiety, does not appear to be a benign essential tremor.  Start beta blocker, short term follow up  Orders: -     Propranolol HCl; Take 1 tablet (40 mg total) by mouth 2 (two) times daily.  Dispense: 60 tablet; Refill:  5  Current every day smoker Assessment & Plan: Will consider low dose CT scan chest in near future  Orders: -     EKG 12-Lead  Psychophysiological insomnia -     hydrOXYzine Pamoate; Take 1 capsule (50 mg total) by mouth at bedtime as needed for anxiety (insomnia).  Dispense: 30 capsule; Refill: 5  Prostate cancer screening -     PSA  Lipid screening -     Lipid panel  Diabetes mellitus screening -     Hemoglobin A1c  Thyroid disorder screen -     TSH  Long-term use of high-risk medication -     Hepatitis C antibody  Encounter for screening for HIV -     HIV Antibody (routine testing w rflx)  Encounter for hepatitis C virus screening test for high risk patient -     Hepatitis C antibody  Behavioral health urgent care and ER precautions discussed with patient regarding his mental health. He is in agreement with plan of care and understands what may prompt emergency care. RTC in 1 week for recheck.  Return in about 1 week (around 09/19/2023).   Maretta Bees, PA

## 2023-09-12 NOTE — Assessment & Plan Note (Signed)
Likely secondary to anxiety. EKG without any concerning findings.

## 2023-09-12 NOTE — Assessment & Plan Note (Signed)
Likely secondary to anxiety, does not appear to be a benign essential tremor.  Start beta blocker, short term follow up

## 2023-09-13 ENCOUNTER — Telehealth: Payer: Self-pay

## 2023-09-13 LAB — HIV ANTIBODY (ROUTINE TESTING W REFLEX): HIV 1&2 Ab, 4th Generation: NONREACTIVE

## 2023-09-13 LAB — HEPATITIS C ANTIBODY: Hepatitis C Ab: NONREACTIVE

## 2023-09-13 NOTE — Telephone Encounter (Signed)
Reason for CRM: Pt returning call to [place].

## 2023-09-20 ENCOUNTER — Ambulatory Visit: Payer: Medicaid Other | Admitting: Urgent Care

## 2023-09-22 ENCOUNTER — Encounter: Payer: Self-pay | Admitting: Urgent Care

## 2023-09-22 ENCOUNTER — Ambulatory Visit (INDEPENDENT_AMBULATORY_CARE_PROVIDER_SITE_OTHER): Payer: Medicaid Other | Admitting: Urgent Care

## 2023-09-22 VITALS — BP 120/75 | HR 60 | Wt 171.8 lb

## 2023-09-22 DIAGNOSIS — F419 Anxiety disorder, unspecified: Secondary | ICD-10-CM

## 2023-09-22 DIAGNOSIS — F411 Generalized anxiety disorder: Secondary | ICD-10-CM | POA: Diagnosis not present

## 2023-09-22 DIAGNOSIS — F339 Major depressive disorder, recurrent, unspecified: Secondary | ICD-10-CM | POA: Diagnosis not present

## 2023-09-22 DIAGNOSIS — F5104 Psychophysiologic insomnia: Secondary | ICD-10-CM

## 2023-09-22 MED ORDER — FLUOXETINE HCL 20 MG PO TABS: 20.0000 mg | ORAL_TABLET | Freq: Every day | ORAL | 3 refills | Status: DC

## 2023-09-22 MED ORDER — CLONAZEPAM 0.5 MG PO TABS: 0.5000 mg | ORAL_TABLET | Freq: Two times a day (BID) | ORAL | 0 refills | Status: DC | PRN

## 2023-09-22 MED ORDER — TRAZODONE HCL 100 MG PO TABS: 100.0000 mg | ORAL_TABLET | Freq: Every day | ORAL | 5 refills | Status: DC

## 2023-09-22 NOTE — Progress Notes (Signed)
 Established Patient Office Visit  Subjective:  Patient ID: Jacob Rhodes, male    DOB: 08/12/1971  Age: 53 y.o. MRN: [id number]  Chief Complaint  Patient presents with   Follow-up    1 week follow up. He is still unable to sleep.    Jacob Rhodes presents today for follow up. Compared to last visit, pt states he is doing remarkably better. Still having severe anxiety but no longer on the verge of a breakdown. Has been tolerating the meds well, just got up to the 10mg  BID buspirone  today. Feels less jittery. Still having significant issues with intermittent panic attacks, primarily while at work, or when alone at home. States atarax  did nothing for his sleep and still only getting about 3 hours. Continues to endorse depression, however again denies SI/HI/AVH. No new complaints today. He is establishing care with a therapist in [place] tomorrow. He is very pleased that he has been substance free for three years and hopes to use his prior experiences to help others in a similar situation. 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     Patient Active Problem List   Diagnosis Date Noted   GAD (generalized anxiety disorder) 09/22/2023   Severe anxiety 09/12/2023   Night sweats 09/12/2023   Chest heaviness 09/12/2023   Weight loss 09/12/2023   Tremulousness 09/12/2023   Current every day smoker 09/12/2023   Psychophysiological insomnia 09/12/2023   Spinal stenosis of cervical region 08/19/2014   Past Medical History:  Diagnosis Date   Anxiety    Depression    Headache    Pinched nerve    Shortness of breath dyspnea    anxiety related   Substance abuse (HCC)    Past Surgical History:  Procedure Laterality Date   ANTERIOR CERVICAL DECOMP/DISCECTOMY FUSION N/A 08/19/2014   Procedure: cervical three -four anterior cervical decompression fusion with peek and plate;  Surgeon:  Arley SHAUNNA Helling, MD;  Location: [facility];  Service: Neurosurgery;  Laterality: N/A;   plyoric stenosis surgery as infant        ROS: as noted in HPI  Objective:     BP 120/75   Pulse 60   Wt 171 lb 12.8 oz (77.9 kg)   SpO2 98%   BMI 23.96 kg/m  BP Readings from Last 3 Encounters:  09/22/23 120/75  09/12/23 (!) 147/95  02/16/16 131/87   Wt Readings from Last 3 Encounters:  09/22/23 171 lb 12.8 oz (77.9 kg)  09/12/23 172 lb 12.8 oz (78.4 kg)  02/16/16 180 lb (81.6 kg)      Physical Exam Vitals and nursing note reviewed.  Constitutional:      General: He is not in acute distress.    Appearance: Normal appearance. He is not ill-appearing, toxic-appearing or diaphoretic.  HENT:     Head: Normocephalic and atraumatic.     Right Ear: External ear normal.     Left Ear: External ear normal.     Nose: Nose normal.  Eyes:     General: No scleral icterus.    Pupils: Pupils are equal, round, and reactive to light.  Cardiovascular:     Rate and Rhythm: Normal rate.  Pulmonary:     Effort: Pulmonary effort is normal. No respiratory distress.  Skin:    General: Skin is warm and dry.     Findings: No erythema or  rash.  Neurological:     General: No focal deficit present.     Mental Status: He is alert and oriented to person, place, and time.  Psychiatric:        Mood and Affect: Mood normal.      No results found for any visits on 09/22/23.  Last CBC Lab Results  Component Value Date   WBC 5.6 09/12/2023   HGB 15.0 09/12/2023   HCT 44.0 09/12/2023   MCV 92.4 09/12/2023   MCH 32.0 08/16/2014   RDW 13.2 09/12/2023   PLT 220.0 09/12/2023   Last metabolic panel Lab Results  Component Value Date   GLUCOSE 100 (H) 09/12/2023   NA 139 09/12/2023   K 4.2 09/12/2023   CL 102 09/12/2023   CO2 28 09/12/2023   BUN 15 09/12/2023   CREATININE 0.88 09/12/2023   GFR 99.25 09/12/2023   CALCIUM 9.6 09/12/2023   PROT 7.2 09/12/2023   ALBUMIN 4.8 09/12/2023   BILITOT  0.5 09/12/2023   ALKPHOS 66 09/12/2023   AST 20 09/12/2023   ALT 15 09/12/2023   ANIONGAP 12 08/16/2014   Last lipids Lab Results  Component Value Date   CHOL 158 09/12/2023   HDL 52.80 09/12/2023   LDLCALC 87 09/12/2023   TRIG 87.0 09/12/2023   CHOLHDL 3 09/12/2023   Last hemoglobin A1c Lab Results  Component Value Date   HGBA1C 5.6 09/12/2023   Last thyroid  functions Lab Results  Component Value Date   TSH 1.41 09/12/2023   Last vitamin D No results found for: 25OHVITD2, 25OHVITD3, VD25OH Last vitamin B12 and Folate No results found for: VITAMINB12, FOLATE    The 10-year ASCVD risk score (Arnett DK, et al., 2019) is: 5.1%  Assessment & Plan:  Severe anxiety Assessment & Plan: Very small supply of benzos given to patient as a bridge therapy until his sleep is restored and his SSRI is in full effect. Pt understands this is not indicated for daily use and can be highly addictive and sedating. All questions/ concerns addressed  Orders: -     clonazePAM ; Take 1 tablet (0.5 mg total) by mouth 2 (two) times daily as needed for anxiety (for severe panic; sedation precaution).  Dispense: 12 tablet; Refill: 0  GAD (generalized anxiety disorder) Assessment & Plan: Start prozac  today to help with GAD and depression. BBW discussed  Orders: -     FLUoxetine  HCl; Take 1 tablet (20 mg total) by mouth daily.  Dispense: 90 tablet; Refill: 3 -     clonazePAM ; Take 1 tablet (0.5 mg total) by mouth 2 (two) times daily as needed for anxiety (for severe panic; sedation precaution).  Dispense: 12 tablet; Refill: 0  Depression, recurrent (HCC) -     FLUoxetine  HCl; Take 1 tablet (20 mg total) by mouth daily.  Dispense: 90 tablet; Refill: 3  Psychophysiological insomnia Assessment & Plan: Stop hydroxyzine , will do trial of trazodone . Pt will send mychart message in 1-2 weeks to see if it is effective. If not, may need to conside seroquel  or remeron. Pt continues to take  melatonin - recommended he try 10mg  in place of his current 5mg   Orders: -     traZODone  HCl; Take 1 tablet (100 mg total) by mouth at bedtime.  Dispense: 30 tablet; Refill: 5     Return in about 4 weeks (around 10/20/2023).   Benton LITTIE Gave, PA

## 2023-09-22 NOTE — Patient Instructions (Addendum)
 Continue with buspirone  10mg  twice daily. Continue with propranolol  40mg  twice daily.  ADD prozac  20mg  once daily. If you develop thoughts of self harm or suicidal ideation after starting the medication, stop it immediately and notify our office.  STOP hydroxyzine  at night. Instead, we will start trazodone .  Please send me a secure message through mychart in 1-2 weeks to let me know if the trazodone  is working. If not, we will try a third option.  I have called in a few tablets of klonopin . This is an addictive medication. This is NOT to be used daily. This is ONLY for severe panic; please also note this is a bridge therapy to help with your symptoms until your above medications are actively working. We do NOT want you on a daily benzodiazepine.  Great job finding a paramedic- this is very important for your betterment. I hope you find the session very therapeutic.  Please schedule a one month follow up to discuss the medication changes today.

## 2023-09-22 NOTE — Assessment & Plan Note (Signed)
 Start prozac today to help with GAD and depression. BBW discussed

## 2023-09-22 NOTE — Assessment & Plan Note (Signed)
 Very small supply of benzos given to patient as a bridge therapy until his sleep is restored and his SSRI is in full effect. Pt understands this is not indicated for daily use and can be highly addictive and sedating. All questions/ concerns addressed

## 2023-09-22 NOTE — Assessment & Plan Note (Signed)
 Stop hydroxyzine, will do trial of trazodone. Pt will send mychart message in 1-2 weeks to see if it is effective. If not, may need to conside seroquel or remeron. Pt continues to take melatonin - recommended he try 10mg  in place of his current 5mg 

## 2023-09-23 ENCOUNTER — Other Ambulatory Visit: Payer: Self-pay | Admitting: Urgent Care

## 2023-09-23 DIAGNOSIS — F411 Generalized anxiety disorder: Secondary | ICD-10-CM

## 2023-09-23 MED ORDER — FLUOXETINE HCL 20 MG PO CAPS: 20.0000 mg | ORAL_CAPSULE | Freq: Every day | ORAL | 3 refills | Status: DC

## 2023-09-26 DIAGNOSIS — H5213 Myopia, bilateral: Secondary | ICD-10-CM | POA: Diagnosis not present

## 2023-10-20 ENCOUNTER — Encounter: Payer: Self-pay | Admitting: Urgent Care

## 2023-10-20 ENCOUNTER — Ambulatory Visit: Payer: Medicaid Other | Admitting: Urgent Care

## 2023-10-20 VITALS — BP 132/80 | HR 50 | Wt 158.1 lb

## 2023-10-20 DIAGNOSIS — R634 Abnormal weight loss: Secondary | ICD-10-CM | POA: Diagnosis not present

## 2023-10-20 DIAGNOSIS — F419 Anxiety disorder, unspecified: Secondary | ICD-10-CM | POA: Diagnosis not present

## 2023-10-20 DIAGNOSIS — F411 Generalized anxiety disorder: Secondary | ICD-10-CM

## 2023-10-20 DIAGNOSIS — F5104 Psychophysiologic insomnia: Secondary | ICD-10-CM

## 2023-10-20 MED ORDER — CLONAZEPAM 0.5 MG PO TABS: 0.5000 mg | ORAL_TABLET | Freq: Two times a day (BID) | ORAL | 0 refills | Status: DC | PRN

## 2023-10-20 MED ORDER — BUSPIRONE HCL 15 MG PO TABS: 15.0000 mg | ORAL_TABLET | Freq: Two times a day (BID) | ORAL | 1 refills | Status: DC

## 2023-10-20 MED ORDER — QUETIAPINE FUMARATE 50 MG PO TABS: 50.0000 mg | ORAL_TABLET | Freq: Every day | ORAL | 1 refills | Status: DC

## 2023-10-20 NOTE — Patient Instructions (Addendum)
 Please stop taking trazodone . Instead, we will start seroquel .  Please take one tablet every night before bed. After 10 days, if needed, you can increase the dose to 100mg  (two tablets) at bedtime.  Increase your buspirone  from 1 tab twice daily to 1.5tab in AM and 1 tab in PM x 1 week, then fill the 15mg  tab called in today and take this twice daily.  Continue your fluoxetine . Continue your propranolol .  Please call the new therapist ASAP to get established. Take the clonazepam  as needed, try not to take daily as this can be addictive. If you find that you are needing on a more consistent basis, please let me know and we can adjust your therapy.  If at any point in time you have a very bad day, severe anxiety, or thoughts of self harm, please head to: [facility] [address] [place], [place] [address] [phone]  Please return for recheck in 4 weeks, sooner as needed

## 2023-10-20 NOTE — Progress Notes (Signed)
 Established Patient Office Visit  Subjective:  Patient ID: Jacob Rhodes, male    DOB: 05/15/1971  Age: 53 y.o. MRN: [id number]  Chief Complaint  Patient presents with   Follow-up    4 week follow up.    HPI  Discussed the use of AI scribe software for clinical note transcription with the patient, who gave verbal consent to proceed.  History of Present Illness   Jacob Rhodes is a 53 year old male who presents with sleep disturbances and anxiety management.  He experiences ongoing sleep disturbances despite medication adjustments. Previously prescribed trazodone  helped him fall asleep but did not prevent frequent awakenings. He has nightmares and wakes up sweating and scared, a pattern persisting since childhood. He estimates getting three to five hours of sleep per night, waking up multiple times between 12 AM and 5:30 AM.  He is currently taking buspirone  twice a day, fluoxetine  once a day, propranolol  twice a day, Suboxone twice a day, and clonazepam  approximately every other day. Clonazepam  is effective in managing his symptoms, particularly on difficult days, but he is cautious about its use due to potential addiction concerns. No adverse side effects from his medications are noted. Pt admits the clonazepam  seemed to provide the most relief of his symptoms. He admits that today is a particularly bad day, but denies SI/HI/AVH.   He is in the process of changing therapists, as he found his previous therapist unhelpful. He has not yet established care with a new therapist but is seeking one in [place]. He lives with his son and his son's girlfriend, and his wife is currently living with her mother. No new concerns or thoughts of self-harm. Works daily as a education administrator, states the work helps to distract his mind and keep him busy.      Patient Active Problem List   Diagnosis Date Noted   GAD (generalized anxiety disorder) 09/22/2023   Severe anxiety 09/12/2023   Night sweats  09/12/2023   Chest heaviness 09/12/2023   Weight loss 09/12/2023   Tremulousness 09/12/2023   Current every day smoker 09/12/2023   Psychophysiological insomnia 09/12/2023   Spinal stenosis of cervical region 08/19/2014   Past Medical History:  Diagnosis Date   Anxiety    Depression    Headache    Pinched nerve    Shortness of breath dyspnea    anxiety related   Substance abuse (HCC)    Past Surgical History:  Procedure Laterality Date   ANTERIOR CERVICAL DECOMP/DISCECTOMY FUSION N/A 08/19/2014   Procedure: cervical three -four anterior cervical decompression fusion with peek and plate;  Surgeon: Arley SHAUNNA Helling, MD;  Location: [facility];  Service: Neurosurgery;  Laterality: N/A;   plyoric stenosis surgery as infant     Social History   Tobacco Use   Smoking status: Every Day    Current packs/day: 0.25    Average packs/day: 0.3 packs/day for 15.0 years (3.8 ttl pk-yrs)    Types: Cigarettes, Cigars  Substance Use Topics   Alcohol use: No   Drug use: Not Currently    Types: Cocaine, Hydrocodone, Marijuana, Morphine, Oxycodone       ROS: as noted in HPI  Objective:     BP 132/80   Pulse (!) 50   Wt 158 lb 1.9 oz (71.7 kg)   SpO2 98%   BMI 22.05 kg/m  BP Readings from Last 3 Encounters:  10/20/23 132/80  09/22/23 120/75  09/12/23 (!) 147/95   Wt Readings from Last 3 Encounters:  10/20/23 158 lb 1.9 oz (71.7 kg)  09/22/23 171 lb 12.8 oz (77.9 kg)  09/12/23 172 lb 12.8 oz (78.4 kg)      Physical Exam Vitals and nursing note reviewed.  Constitutional:      General: He is not in acute distress.    Appearance: Normal appearance. He is not ill-appearing, toxic-appearing or diaphoretic.  HENT:     Head: Normocephalic and atraumatic.     Right Ear: External ear normal.     Left Ear: External ear normal.     Nose: Nose normal.  Eyes:     General: No scleral icterus.    Pupils: Pupils are equal, round, and reactive to light.  Cardiovascular:     Rate and  Rhythm: Normal rate.  Pulmonary:     Effort: Pulmonary effort is normal. No respiratory distress.  Skin:    General: Skin is warm and dry.     Findings: No erythema or rash.  Neurological:     General: No focal deficit present.     Mental Status: He is alert and oriented to person, place, and time.  Psychiatric:        Attention and Perception: Attention normal.        Mood and Affect: Mood is anxious and depressed.        Speech: Speech normal.     Comments: Pt with mild tremor, picking at nails      No results found for any visits on 10/20/23.  Last CBC Lab Results  Component Value Date   WBC 5.6 09/12/2023   HGB 15.0 09/12/2023   HCT 44.0 09/12/2023   MCV 92.4 09/12/2023   MCH 32.0 08/16/2014   RDW 13.2 09/12/2023   PLT 220.0 09/12/2023   Last metabolic panel Lab Results  Component Value Date   GLUCOSE 100 (H) 09/12/2023   NA 139 09/12/2023   K 4.2 09/12/2023   CL 102 09/12/2023   CO2 28 09/12/2023   BUN 15 09/12/2023   CREATININE 0.88 09/12/2023   GFR 99.25 09/12/2023   CALCIUM 9.6 09/12/2023   PROT 7.2 09/12/2023   ALBUMIN 4.8 09/12/2023   BILITOT 0.5 09/12/2023   ALKPHOS 66 09/12/2023   AST 20 09/12/2023   ALT 15 09/12/2023   ANIONGAP 12 08/16/2014   Last lipids Lab Results  Component Value Date   CHOL 158 09/12/2023   HDL 52.80 09/12/2023   LDLCALC 87 09/12/2023   TRIG 87.0 09/12/2023   CHOLHDL 3 09/12/2023   Last hemoglobin A1c Lab Results  Component Value Date   HGBA1C 5.6 09/12/2023      The 10-year ASCVD risk score (Arnett DK, et al., 2019) is: 6.4%  Assessment & Plan:  Psychophysiological insomnia -     QUEtiapine  Fumarate; Take 1 tablet (50 mg total) by mouth at bedtime.  Dispense: 30 tablet; Refill: 1  Severe anxiety -     busPIRone  HCl; Take 1 tablet (15 mg total) by mouth 2 (two) times daily.  Dispense: 180 tablet; Refill: 1 -     clonazePAM ; Take 1 tablet (0.5 mg total) by mouth 2 (two) times daily as needed for anxiety (for  severe panic; sedation precaution).  Dispense: 20 tablet; Refill: 0 -     QUEtiapine  Fumarate; Take 1 tablet (50 mg total) by mouth at bedtime.  Dispense: 30 tablet; Refill: 1  GAD (generalized anxiety disorder) -     busPIRone  HCl; Take 1 tablet (15 mg total) by mouth 2 (two) times daily.  Dispense: 180 tablet; Refill: 1 -     clonazePAM ; Take 1 tablet (0.5 mg total) by mouth 2 (two) times daily as needed for anxiety (for severe panic; sedation precaution).  Dispense: 20 tablet; Refill: 0  Weight loss    Assessment and Plan    Insomnia Persistent despite Trazodone . Reports difficulty staying asleep with frequent awakenings due to nightmares. Trazodone  has helped with sleep initiation but not maintenance. -Discontinue Trazodone . -Start Seroquel  50mg  at bedtime for sleep maintenance and anxiety.  Anxiety Ongoing despite current regimen. Reports nightmares and poor sleep contributing to anxiety. -Increase Buspirone  from 10mg  to 15mg  twice daily. -Continue Fluoxetine  and Clonazepam  as prescribed. -Encouraged to seek new therapist for ongoing psychotherapy.  General Health Maintenance -Provided information for urgent mental health care facility for use in crisis situations.     Further weight loss noted, likely due to continued symptoms. Will provide 20 tabs clonazepam  for more consistent use, consider psychiatry follow up if symptoms fail to respond to todays adjustments.   Return in about 4 weeks (around 11/17/2023) for follow up.   Benton LITTIE Gave, PA

## 2023-10-26 ENCOUNTER — Encounter: Payer: Self-pay | Admitting: Urgent Care

## 2023-10-26 ENCOUNTER — Telehealth: Payer: Self-pay | Admitting: Pharmacy Technician

## 2023-10-26 ENCOUNTER — Other Ambulatory Visit (HOSPITAL_COMMUNITY): Payer: Self-pay

## 2023-10-26 NOTE — Telephone Encounter (Signed)
Pharmacy Patient Advocate Encounter  Received notification from [facility] MEDICAID that Prior Authorization for QUEtiapine Fumarate 50MG  tablets  has been APPROVED from 10/26/2023 to 10/25/2024. Ran test claim, Copay is $4.00. This test claim was processed through [facility]- copay amounts may vary at other pharmacies due to pharmacy/plan contracts, or as the patient moves through the different stages of their insurance plan.   PA #/Case ID/Reference #: [id number]

## 2023-10-26 NOTE — Telephone Encounter (Signed)
Pharmacy Patient Advocate Encounter   Received notification from CoverMyMeds that prior authorization for QUEtiapine Fumarate 50MG  tablets is required/requested.   Insurance verification completed.   The patient is insured through [facility] MEDICAID .   Per test claim: PA required; PA submitted to above mentioned insurance via CoverMyMeds Key/confirmation #/EOC [id number] Status is pending

## 2023-11-08 ENCOUNTER — Encounter: Payer: Self-pay | Admitting: Urgent Care

## 2023-11-15 ENCOUNTER — Ambulatory Visit (INDEPENDENT_AMBULATORY_CARE_PROVIDER_SITE_OTHER): Payer: Medicaid Other | Admitting: Urgent Care

## 2023-11-15 ENCOUNTER — Encounter: Payer: Self-pay | Admitting: Urgent Care

## 2023-11-15 VITALS — BP 106/66 | HR 54 | Wt 158.0 lb

## 2023-11-15 DIAGNOSIS — F5104 Psychophysiologic insomnia: Secondary | ICD-10-CM

## 2023-11-15 DIAGNOSIS — Z1211 Encounter for screening for malignant neoplasm of colon: Secondary | ICD-10-CM | POA: Diagnosis not present

## 2023-11-15 DIAGNOSIS — F411 Generalized anxiety disorder: Secondary | ICD-10-CM | POA: Diagnosis not present

## 2023-11-15 DIAGNOSIS — F419 Anxiety disorder, unspecified: Secondary | ICD-10-CM

## 2023-11-15 DIAGNOSIS — R251 Tremor, unspecified: Secondary | ICD-10-CM | POA: Diagnosis not present

## 2023-11-15 DIAGNOSIS — F339 Major depressive disorder, recurrent, unspecified: Secondary | ICD-10-CM

## 2023-11-15 DIAGNOSIS — R0789 Other chest pain: Secondary | ICD-10-CM

## 2023-11-15 DIAGNOSIS — Z23 Encounter for immunization: Secondary | ICD-10-CM

## 2023-11-15 MED ORDER — SHINGRIX 50 MCG/0.5ML IM SUSR: 0.5000 mL | Freq: Once | INTRAMUSCULAR | 1 refills | Status: AC

## 2023-11-15 MED ORDER — CLONAZEPAM 0.5 MG PO TABS
0.5000 mg | ORAL_TABLET | Freq: Two times a day (BID) | ORAL | 0 refills | Status: DC | PRN
Start: 2023-11-15 — End: 2023-12-13

## 2023-11-15 MED ORDER — QUETIAPINE FUMARATE 200 MG PO TABS
200.0000 mg | ORAL_TABLET | Freq: Every day | ORAL | 0 refills | Status: DC
Start: 2023-11-15 — End: 2023-12-13

## 2023-11-15 MED ORDER — PNEUMOCOCCAL VAC POLYVALENT 25 MCG/0.5ML IJ SOSY: 0.5000 mL | PREFILLED_SYRINGE | Freq: Once | INTRAMUSCULAR | 0 refills | Status: AC

## 2023-11-15 MED ORDER — PROPRANOLOL HCL 40 MG PO TABS: 40.0000 mg | ORAL_TABLET | Freq: Two times a day (BID) | ORAL | 1 refills | Status: DC

## 2023-11-15 MED ORDER — TETANUS-DIPHTH-ACELL PERTUSSIS 5-2.5-18.5 LF-MCG/0.5 IM SUSY: 0.5000 mL | PREFILLED_SYRINGE | Freq: Once | INTRAMUSCULAR | 0 refills | Status: AC

## 2023-11-15 NOTE — Progress Notes (Signed)
 Established Patient Office Visit  Subjective:  Patient ID: Jacob Rhodes, male    DOB: 1971-08-20  Age: 53 y.o. MRN: [id number]  Chief Complaint  Patient presents with   Follow-up    Follow up from last visit. Pt is interested in Cologuard. One of the specialist he sees suggest him having his liver checked due to medications he takes.    HPI  Discussed the use of AI scribe software for clinical note transcription with the patient, who gave verbal consent to proceed.  History of Present Illness   Jacob Rhodes is a 53 year old male who presents for medication management and follow-up for anxiety and sleep disturbances.  He is currently taking buspirone 15 mg twice daily, fluoxetine 20 mg once daily, propranolol 40 mg twice daily, and clonazepam as needed for anxiety. Clonazepam is the most effective for his anxiety, but he uses it cautiously to avoid daily use due to its addictive potential. He has about four or five tablets left from the initial prescription of twenty tablets. He has been experiencing more good days recently, although he still has some bad days. His wife is considering returning home soon, and he believes that showing her his improvement will positively impact his life.  Regarding sleep, he initially struggled with sleep while taking one Seroquel tablet but has been taking two tablets for about a week, which has improved his sleep duration to four or five hours straight. However, he still has difficulty falling asleep, often taking hours to do so. His routine includes watching TV before bed, which helps distract his mind, but once the TV is off, his mind becomes more active, making it difficult to sleep. He has not been taking melatonin recently and is open to trying other methods to improve sleep, such as music or herbal teas. He is currently taking 100 mg of Seroquel nightly, with the option to increase the dose if needed.  He reports a weight of 158 pounds, down  from 170 pounds in January, but has been stable for the past month. He attributes the previous weight loss to uncontrolled anxiety, which has since improved. His liver function was last checked in December and was normal.  He is due for vaccines, including shingles, pneumonia, and tetanus, but his insurance requires administration at a pharmacy.      Patient Active Problem List   Diagnosis Date Noted   GAD (generalized anxiety disorder) 09/22/2023   Severe anxiety 09/12/2023   Night sweats 09/12/2023   Chest heaviness 09/12/2023   Weight loss 09/12/2023   Tremulousness 09/12/2023   Current every day smoker 09/12/2023   Psychophysiological insomnia 09/12/2023   Spinal stenosis of cervical region 08/19/2014   Past Medical History:  Diagnosis Date   Anxiety    Depression    Headache    Pinched nerve    Shortness of breath dyspnea    anxiety related   Substance abuse (HCC)    Past Surgical History:  Procedure Laterality Date   ANTERIOR CERVICAL DECOMP/DISCECTOMY FUSION N/A 08/19/2014   Procedure: cervical three -four anterior cervical decompression fusion with peek and plate;  Surgeon: Mariam Dollar, MD;  Location: [facility];  Service: Neurosurgery;  Laterality: N/A;   plyoric stenosis surgery as infant     Social History   Tobacco Use   Smoking status: Every Day    Current packs/day: 0.25    Average packs/day: 0.3 packs/day for 15.0 years (3.8 ttl pk-yrs)    Types: Cigarettes, Cigars  Substance Use Topics   Alcohol use: No   Drug use: Not Currently    Types: Cocaine, Hydrocodone, Marijuana, Morphine, Oxycodone      ROS: as noted in HPI  Objective:     BP 106/66   Pulse (!) 54   Wt 158 lb (71.7 kg)   SpO2 99%   BMI 22.04 kg/m  BP Readings from Last 3 Encounters:  11/15/23 106/66  10/20/23 132/80  09/22/23 120/75   Wt Readings from Last 3 Encounters:  11/15/23 158 lb (71.7 kg)  10/20/23 158 lb 1.9 oz (71.7 kg)  09/22/23 171 lb 12.8 oz (77.9 kg)       Physical Exam Vitals and nursing note reviewed.  Constitutional:      General: He is not in acute distress.    Appearance: Normal appearance. He is not ill-appearing, toxic-appearing or diaphoretic.  HENT:     Head: Normocephalic and atraumatic.     Right Ear: External ear normal.     Left Ear: External ear normal.     Nose: Nose normal.  Eyes:     General: No scleral icterus.    Pupils: Pupils are equal, round, and reactive to light.  Cardiovascular:     Rate and Rhythm: Normal rate.  Pulmonary:     Effort: Pulmonary effort is normal. No respiratory distress.  Skin:    General: Skin is warm and dry.     Findings: No erythema or rash.  Neurological:     General: No focal deficit present.     Mental Status: He is alert and oriented to person, place, and time.      No results found for any visits on 11/15/23.  Last CBC Lab Results  Component Value Date   WBC 5.6 09/12/2023   HGB 15.0 09/12/2023   HCT 44.0 09/12/2023   MCV 92.4 09/12/2023   MCH 32.0 08/16/2014   RDW 13.2 09/12/2023   PLT 220.0 09/12/2023   Last metabolic panel Lab Results  Component Value Date   GLUCOSE 100 (H) 09/12/2023   NA 139 09/12/2023   K 4.2 09/12/2023   CL 102 09/12/2023   CO2 28 09/12/2023   BUN 15 09/12/2023   CREATININE 0.88 09/12/2023   GFR 99.25 09/12/2023   CALCIUM 9.6 09/12/2023   PROT 7.2 09/12/2023   ALBUMIN 4.8 09/12/2023   BILITOT 0.5 09/12/2023   ALKPHOS 66 09/12/2023   AST 20 09/12/2023   ALT 15 09/12/2023   ANIONGAP 12 08/16/2014      The 10-year ASCVD risk score (Arnett DK, et al., 2019) is: 4.4%  Assessment & Plan:  GAD (generalized anxiety disorder) -     clonazePAM; Take 1 tablet (0.5 mg total) by mouth 2 (two) times daily as needed for anxiety (for severe panic; sedation precaution).  Dispense: 20 tablet; Refill: 0  Psychophysiological insomnia -     QUEtiapine Fumarate; Take 1 tablet (200 mg total) by mouth at bedtime.  Dispense: 90 tablet; Refill:  0  Depression, recurrent (HCC)  Vaccine for streptococcus pneumoniae and influenza -     Pneumococcal Vac Polyvalent; Inject 0.5 mLs into the muscle once for 1 dose.  Dispense: 0.5 mL; Refill: 0  Need for shingles vaccine -     Shingrix; Inject 0.5 mLs into the muscle once for 1 dose.  Dispense: 0.5 mL; Refill: 1  Need for tetanus, diphtheria, and acellular pertussis (Tdap) vaccine -     Tetanus-Diphth-Acell Pertussis; Inject 0.5 mLs into the muscle once for 1  dose. Administer at pharmacy  Dispense: 0.5 mL; Refill: 0  Colon cancer screening -     Cologuard  Severe anxiety -     clonazePAM; Take 1 tablet (0.5 mg total) by mouth 2 (two) times daily as needed for anxiety (for severe panic; sedation precaution).  Dispense: 20 tablet; Refill: 0 -     Propranolol HCl; Take 1 tablet (40 mg total) by mouth 2 (two) times daily.  Dispense: 180 tablet; Refill: 1  Chest heaviness -     Propranolol HCl; Take 1 tablet (40 mg total) by mouth 2 (two) times daily.  Dispense: 180 tablet; Refill: 1  Tremulousness -     Propranolol HCl; Take 1 tablet (40 mg total) by mouth 2 (two) times daily.  Dispense: 180 tablet; Refill: 1  Assessment and Plan    Anxiety Patient reports some improvement with current regimen of Buspirone 15mg  BID, Prozac 20mg  daily, and Propranolol 40mg  BID. Clonazepam used sparingly and effectively for acute anxiety episodes. Discussed the risk of dependency with daily use of Clonazepam and the potential to increase doses of Buspirone and Prozac if needed. -Continue current regimen. -Consider increasing Buspirone and/or Prozac if daily use of Clonazepam becomes necessary.  Insomnia Patient reports difficulty falling asleep, but improved duration of sleep since increasing Seroquel to 100mg  nightly. Discussed non-pharmacological strategies to improve sleep onset, including avoiding screen time before bed, listening to music, and using white noise. -Increase Seroquel to 200mg  nightly  if needed after trying non-pharmacological strategies. -Consider adding Melatonin 10mg  nightly.  General Health Maintenance -Complete Cologuard test for colorectal cancer screening. -Administer Shingles and Tetanus vaccines at pharmacy this month, Pneumonia vaccine next month, and second dose of Shingles vaccine the following month. -Repeat comprehensive labs in June 2025.         Return in about 4 weeks (around 12/13/2023).   Maretta Bees, PA

## 2023-11-15 NOTE — Patient Instructions (Addendum)
 Take 100mg  seroquel (1/2 tab) nightly for the next 1-2 weeks. If needed, increase to one full tab (200mg ) Re-start melatonin 10mg  nightly.   Please consider some of the sleep hygiene measures discussed today: Switch to calming music rather than TV prior to bed. Try herbal caffeine free tea that can promote sleep Try white noise or gentle, light sounds like waterfalls to help you sleep.   Please update your vaccines at the pharmacy, have them send [place] confirmation of completion: Today - update 1st Shingrix and Tdap One month from today - update pneumonia vaccine 2 months from today - get 2nd shingrix  Cologard test was ordered for colon screening  Continue all your other medications as ordered. We will continue to try and find a medicaid approved therapist. For now, keep the one you have scheduled next month.

## 2023-11-22 DIAGNOSIS — Z1211 Encounter for screening for malignant neoplasm of colon: Secondary | ICD-10-CM | POA: Diagnosis not present

## 2023-11-30 LAB — COLOGUARD: COLOGUARD: NEGATIVE

## 2023-12-13 ENCOUNTER — Ambulatory Visit (INDEPENDENT_AMBULATORY_CARE_PROVIDER_SITE_OTHER): Admitting: Urgent Care

## 2023-12-13 ENCOUNTER — Encounter: Payer: Self-pay | Admitting: Urgent Care

## 2023-12-13 DIAGNOSIS — F419 Anxiety disorder, unspecified: Secondary | ICD-10-CM | POA: Diagnosis not present

## 2023-12-13 DIAGNOSIS — F411 Generalized anxiety disorder: Secondary | ICD-10-CM

## 2023-12-13 DIAGNOSIS — F5104 Psychophysiologic insomnia: Secondary | ICD-10-CM

## 2023-12-13 MED ORDER — FLUOXETINE HCL 40 MG PO CAPS: 40.0000 mg | ORAL_CAPSULE | Freq: Every day | ORAL | 1 refills | Status: DC

## 2023-12-13 MED ORDER — CLONAZEPAM 0.5 MG PO TABS: 0.5000 mg | ORAL_TABLET | Freq: Two times a day (BID) | ORAL | 0 refills | Status: DC | PRN

## 2023-12-13 MED ORDER — QUETIAPINE FUMARATE 200 MG PO TABS: 200.0000 mg | ORAL_TABLET | Freq: Every day | ORAL | 0 refills | Status: DC

## 2023-12-13 NOTE — Progress Notes (Signed)
 Established Patient Office Visit  Subjective:  Patient ID: Jacob Rhodes, male    DOB: 1971/01/09  Age: 53 y.o. MRN: [id number]  Chief Complaint  Patient presents with   Follow-up    Follow up from last visit. No questions or concerns. Pt will need refills today.    HPI Discussed the use of AI scribe software for clinical note transcription with the patient, who gave verbal consent to proceed.  History of Present Illness   Jacob Rhodes is a 53 year old male who presents with sleep disturbances and weight loss.  He experiences sleep disturbances, characterized by difficulty falling asleep and maintaining sleep. He notes some improvement in falling asleep faster, which he attributes to turning off the TV and listening to music before bed. He is currently taking 200 mg of Seroquel, which he increased from a half dose, and reports this has helped improve his sleep. Despite these changes, he estimates getting only four to six hours of sleep per night.  He reports a significant weight loss of approximately 18 pounds over the past four months, with his weight decreasing from 172 pounds in December to 154 pounds currently. He attributes this to a lack of appetite, stating he typically eats only once a day, usually at lunch. He acknowledges the weight loss and expresses difficulty in feeling happy, which he believes may contribute to his decreased appetite.  He is currently taking several medications, including 20 mg of fluoxetine, Buspar, clonazepam, Inderal, and Seroquel. He confirms adherence to these medications and notes that he recently ran out of clonazepam. He is willing to adjust his fluoxetine dosage if necessary.  He lives with his 21 year old son and his son's girlfriend. He enjoys taking his grandkids to the park on weekends.       Patient Active Problem List   Diagnosis Date Noted   GAD (generalized anxiety disorder) 09/22/2023   Severe anxiety 09/12/2023   Night  sweats 09/12/2023   Chest heaviness 09/12/2023   Weight loss 09/12/2023   Tremulousness 09/12/2023   Current every day smoker 09/12/2023   Psychophysiological insomnia 09/12/2023   Spinal stenosis of cervical region 08/19/2014   Past Medical History:  Diagnosis Date   Anxiety    Depression    Headache    Pinched nerve    Shortness of breath dyspnea    anxiety related   Substance abuse (HCC)    Past Surgical History:  Procedure Laterality Date   ANTERIOR CERVICAL DECOMP/DISCECTOMY FUSION N/A 08/19/2014   Procedure: cervical three -four anterior cervical decompression fusion with peek and plate;  Surgeon: Mariam Dollar, MD;  Location: [facility];  Service: Neurosurgery;  Laterality: N/A;   plyoric stenosis surgery as infant     Social History   Tobacco Use   Smoking status: Every Day    Current packs/day: 0.25    Average packs/day: 0.3 packs/day for 15.0 years (3.8 ttl pk-yrs)    Types: Cigarettes, Cigars  Substance Use Topics   Alcohol use: No   Drug use: Not Currently    Types: Cocaine, Hydrocodone, Marijuana, Morphine, Oxycodone      ROS: as noted in HPI  Objective:     BP 112/78   Pulse (!) 57   Wt 154 lb 6.4 oz (70 kg)   SpO2 97%   BMI 21.53 kg/m  BP Readings from Last 3 Encounters:  12/13/23 112/78  11/15/23 106/66  10/20/23 132/80   Wt Readings from Last 3 Encounters:  12/13/23 154 lb  6.4 oz (70 kg)  11/15/23 158 lb (71.7 kg)  10/20/23 158 lb 1.9 oz (71.7 kg)      Physical Exam Vitals and nursing note reviewed.  Constitutional:      General: He is not in acute distress.    Appearance: Normal appearance. He is not ill-appearing, toxic-appearing or diaphoretic.  HENT:     Head: Normocephalic and atraumatic.     Right Ear: Tympanic membrane, ear canal and external ear normal. There is no impacted cerumen.     Left Ear: Tympanic membrane, ear canal and external ear normal. There is no impacted cerumen.     Nose: Nose normal.     Mouth/Throat:      Mouth: Mucous membranes are moist.     Pharynx: Oropharynx is clear. No oropharyngeal exudate or posterior oropharyngeal erythema.  Eyes:     General: No scleral icterus.    Pupils: Pupils are equal, round, and reactive to light.  Neck:     Thyroid: No thyroid mass, thyromegaly or thyroid tenderness.  Cardiovascular:     Rate and Rhythm: Normal rate and regular rhythm.     Heart sounds: No murmur heard. Pulmonary:     Effort: Pulmonary effort is normal. No respiratory distress.  Musculoskeletal:     Cervical back: Normal range of motion and neck supple. No rigidity or tenderness.  Lymphadenopathy:     Cervical: No cervical adenopathy.  Skin:    General: Skin is warm and dry.     Findings: No erythema or rash.  Neurological:     General: No focal deficit present.     Mental Status: He is alert and oriented to person, place, and time.     Cranial Nerves: No cranial nerve deficit.     Gait: Gait normal.      No results found for any visits on 12/13/23.  Last CBC Lab Results  Component Value Date   WBC 5.6 09/12/2023   HGB 15.0 09/12/2023   HCT 44.0 09/12/2023   MCV 92.4 09/12/2023   MCH 32.0 08/16/2014   RDW 13.2 09/12/2023   PLT 220.0 09/12/2023   Last metabolic panel Lab Results  Component Value Date   GLUCOSE 100 (H) 09/12/2023   NA 139 09/12/2023   K 4.2 09/12/2023   CL 102 09/12/2023   CO2 28 09/12/2023   BUN 15 09/12/2023   CREATININE 0.88 09/12/2023   GFR 99.25 09/12/2023   CALCIUM 9.6 09/12/2023   PROT 7.2 09/12/2023   ALBUMIN 4.8 09/12/2023   BILITOT 0.5 09/12/2023   ALKPHOS 66 09/12/2023   AST 20 09/12/2023   ALT 15 09/12/2023   ANIONGAP 12 08/16/2014   Last lipids Lab Results  Component Value Date   CHOL 158 09/12/2023   HDL 52.80 09/12/2023   LDLCALC 87 09/12/2023   TRIG 87.0 09/12/2023   CHOLHDL 3 09/12/2023   Last hemoglobin A1c Lab Results  Component Value Date   HGBA1C 5.6 09/12/2023   Last thyroid functions Lab Results   Component Value Date   TSH 1.41 09/12/2023   Last vitamin D No results found for: "25OHVITD2", "25OHVITD3", "VD25OH" Last vitamin B12 and Folate No results found for: "VITAMINB12", "FOLATE"    The 10-year ASCVD risk score (Arnett DK, et al., 2019) is: 4.8%  Assessment & Plan:  GAD (generalized anxiety disorder) -     clonazePAM; Take 1 tablet (0.5 mg total) by mouth 2 (two) times daily as needed for anxiety (for severe panic; sedation precaution).  Dispense:  20 tablet; Refill: 0 -     FLUoxetine HCl; Take 1 capsule (40 mg total) by mouth daily.  Dispense: 90 capsule; Refill: 1 -     Ambulatory referral to Psychiatry  Severe anxiety -     clonazePAM; Take 1 tablet (0.5 mg total) by mouth 2 (two) times daily as needed for anxiety (for severe panic; sedation precaution).  Dispense: 20 tablet; Refill: 0 -     Ambulatory referral to Psychiatry  Psychophysiological insomnia -     QUEtiapine Fumarate; Take 1 tablet (200 mg total) by mouth at bedtime.  Dispense: 90 tablet; Refill: 0 -     Ambulatory referral to Psychiatry  Assessment and Plan    Insomnia Chronic insomnia with some improvement in sleep onset and maintenance. He has been using non-pharmacological methods such as turning off the TV and listening to music, which has helped. Currently taking 200 mg of Seroquel, which has improved sleep but still not achieving ideal sleep duration. He is currently getting 4 to 6 hours of sleep, which is an improvement but not ideal. - Continue Seroquel 200 mg nightly. - Consider increasing Seroquel to 300 mg for a few nights to assess improvement in sleep duration.  Depression Chronic depression with ongoing symptoms of low mood and lack of appetite. Current medication regimen includes fluoxetine 20 mg, which may be insufficient. He has experienced significant weight loss, likely related to decreased appetite and depression. Difficulty accessing therapy due to insurance issues. Discussed ECT as  a potential adjunctive treatment for depression.  - Increase fluoxetine to 40 mg daily. - Refer to psychiatry for evaluation of ECT as an adjunctive treatment for depression. - Monitor weight and encourage increased caloric intake, including trying to eat breakfast. - Reorder fluoxetine and clonazepam prescriptions.  General Health Maintenance Routine health maintenance is up to date. Recent colon cancer screening with Cologuard was negative.  Follow-up He prefers to follow up in one month to reassess medication adjustments and explore ECT options. - Schedule follow-up appointment in one month.         Return in about 4 weeks (around 01/10/2024).   Maretta Bees, PA

## 2023-12-13 NOTE — Patient Instructions (Addendum)
 You have been referred to [facility]. I would like you to consider ECT treatments. Please call the number below to set up a consultation: [phone].  Please increase your prozac to 40mg  daily. Please increase your seroquel to 1.5 tabs (300mg ) nightly and let me know if this dose works best for you  Please return in one month for follow up and recheck.

## 2024-01-09 ENCOUNTER — Ambulatory Visit (HOSPITAL_COMMUNITY): Admitting: Family

## 2024-01-09 ENCOUNTER — Encounter: Payer: Self-pay | Admitting: Urgent Care

## 2024-01-09 ENCOUNTER — Ambulatory Visit (INDEPENDENT_AMBULATORY_CARE_PROVIDER_SITE_OTHER): Admitting: Urgent Care

## 2024-01-09 VITALS — BP 112/72 | HR 63 | Wt 151.8 lb

## 2024-01-09 DIAGNOSIS — F514 Sleep terrors [night terrors]: Secondary | ICD-10-CM

## 2024-01-09 DIAGNOSIS — F5104 Psychophysiologic insomnia: Secondary | ICD-10-CM | POA: Diagnosis not present

## 2024-01-09 DIAGNOSIS — F419 Anxiety disorder, unspecified: Secondary | ICD-10-CM | POA: Diagnosis not present

## 2024-01-09 DIAGNOSIS — F411 Generalized anxiety disorder: Secondary | ICD-10-CM | POA: Diagnosis not present

## 2024-01-09 MED ORDER — QUETIAPINE FUMARATE 200 MG PO TABS: 300.0000 mg | ORAL_TABLET | Freq: Every day | ORAL | 0 refills | Status: DC

## 2024-01-09 MED ORDER — CLONAZEPAM 0.5 MG PO TABS: 0.5000 mg | ORAL_TABLET | Freq: Two times a day (BID) | ORAL | 0 refills | Status: DC

## 2024-01-09 MED ORDER — PRAZOSIN HCL 1 MG PO CAPS: 1.0000 mg | ORAL_CAPSULE | Freq: Every day | ORAL | 0 refills | Status: DC

## 2024-01-09 NOTE — Patient Instructions (Addendum)
 Stop your propranolol .  Instead, we will start prazosin. Take one tablet nightly before bed. Make sure you stay hydrated. This can lower blood pressure - if you feel dizzy or lightheaded after taking, please notify me.  Continue your seroquel , buspar  and prozac . You can take your klonazepam twice daily, scheduled.  I have placed a new referral to therapy - we will work diligently to find one that is covered by your insurance.  Please return in 1 month for recheck, sooner as needed.

## 2024-01-09 NOTE — Progress Notes (Signed)
 Established Patient Office Visit  Subjective:  Patient ID: Jacob Rhodes, male    DOB: [birth year]  Age: 53 y.o. MRN: [id number]  Chief Complaint  Patient presents with   Follow-up    4 week follow up. Will need refills    HPI  Discussed the use of AI scribe software for clinical note transcription with the patient, who gave verbal consent to proceed.  History of Present Illness   Jamian Hugel is a 53 year old male who presents for follow-up on his mental health treatment.  He has experienced significant improvement in anxiety symptoms since starting treatment, with a decrease in his PHQ-9 score from 25 to 16. Clonazepam  is the most helpful medication, although he is cautious about its use due to its addictive potential. He takes clonazepam  as needed, sometimes up to two tablets a day, but tries to limit its use.  He experiences persistent insomnia characterized by difficulty staying asleep and frequent awakenings due to bad dreams. These dreams are bothersome and often recur if he falls back asleep. He sometimes feels as though he is having a 'panic attack while asleep.' He is currently taking 300 mg of Seroquel  at night, which helps him fall asleep but does not prevent the bad dreams.  He is also taking propranolol  for anxiety and pulse rate management. He has been on Buspar  for anxiety as well.  He reports ongoing weight loss and poor sleep quality, which continue to affect his daily life. He has been unable to access therapy due to insurance limitations, despite recognizing the need for cognitive behavioral therapy.  No consistent restful sleep and experiences panic-like symptoms during sleep.      Patient Active Problem List   Diagnosis Date Noted   GAD (generalized anxiety disorder) 09/22/2023   Severe anxiety 09/12/2023   Night sweats 09/12/2023   Chest heaviness 09/12/2023   Weight loss 09/12/2023   Tremulousness 09/12/2023   Current every day smoker  09/12/2023   Psychophysiological insomnia 09/12/2023   Spinal stenosis of cervical region 08/19/2014   Past Medical History:  Diagnosis Date   Anxiety    Depression    Headache    Pinched nerve    Shortness of breath dyspnea    anxiety related   Substance abuse (HCC)    Past Surgical History:  Procedure Laterality Date   ANTERIOR CERVICAL DECOMP/DISCECTOMY FUSION N/A 08/19/2014   Procedure: cervical three -four anterior cervical decompression fusion with peek and plate;  Surgeon: Ferris Hua, MD;  Location: [facility];  Service: Neurosurgery;  Laterality: N/A;   plyoric stenosis surgery as infant     Social History   Tobacco Use   Smoking status: Every Day    Current packs/day: 0.25    Average packs/day: 0.3 packs/day for 15.0 years (3.8 ttl pk-yrs)    Types: Cigarettes, Cigars  Substance Use Topics   Alcohol use: No   Drug use: Not Currently    Types: Cocaine, Hydrocodone, Marijuana, Morphine, Oxycodone       ROS: as noted in HPI  Objective:     BP 112/72   Pulse 63   Wt 151 lb 12.8 oz (68.9 kg)   SpO2 100%   BMI 21.17 kg/m  BP Readings from Last 3 Encounters:  01/09/24 112/72  12/13/23 112/78  11/15/23 106/66   Wt Readings from Last 3 Encounters:  01/09/24 151 lb 12.8 oz (68.9 kg)  12/13/23 154 lb 6.4 oz (70 kg)  11/15/23 158 lb (71.7 kg)  Physical Exam Vitals and nursing note reviewed.  Constitutional:      General: He is not in acute distress.    Appearance: Normal appearance. He is not ill-appearing, toxic-appearing or diaphoretic.  HENT:     Head: Normocephalic and atraumatic.     Right Ear: External ear normal.     Left Ear: External ear normal.     Nose: Nose normal.  Eyes:     General: No scleral icterus.    Pupils: Pupils are equal, round, and reactive to light.  Cardiovascular:     Rate and Rhythm: Normal rate.  Pulmonary:     Effort: Pulmonary effort is normal. No respiratory distress.  Skin:    General: Skin is warm and dry.      Findings: No erythema or rash.  Neurological:     General: No focal deficit present.     Mental Status: He is alert and oriented to person, place, and time.  Psychiatric:        Attention and Perception: Attention and perception normal.        Mood and Affect: Mood is anxious.        Speech: Speech normal.        Thought Content: Thought content normal. Thought content does not include homicidal or suicidal ideation.        Cognition and Memory: Cognition and memory normal.        01/09/2024    8:52 AM 09/12/2023    9:13 AM  GAD 7 : Generalized Anxiety Score  Nervous, Anxious, on Edge 2 3  Control/stop worrying 3 3  Worry too much - different things 3 3  Trouble relaxing 2 3  Restless 1 3  Easily annoyed or irritable 1 3  Afraid - awful might happen 3 3  Total GAD 7 Score 15 21  Anxiety Difficulty Somewhat difficult Extremely difficult       01/09/2024    8:50 AM 09/12/2023    9:13 AM  Depression screen PHQ 2/9  Decreased Interest 2 3  Down, Depressed, Hopeless 2 3  PHQ - 2 Score 4 6  Altered sleeping 2 3  Tired, decreased energy 2 3  Change in appetite 3 3  Feeling bad or failure about yourself  2 3  Trouble concentrating 2 3  Moving slowly or fidgety/restless 1 3  Suicidal thoughts 0 1  PHQ-9 Score 16 25  Difficult doing work/chores Somewhat difficult Extremely dIfficult      No results found for any visits on 01/09/24.  Last CBC Lab Results  Component Value Date   WBC 5.6 09/12/2023   HGB 15.0 09/12/2023   HCT 44.0 09/12/2023   MCV 92.4 09/12/2023   MCH 32.0 08/16/2014   RDW 13.2 09/12/2023   PLT 220.0 09/12/2023   Last metabolic panel Lab Results  Component Value Date   GLUCOSE 100 (H) 09/12/2023   NA 139 09/12/2023   K 4.2 09/12/2023   CL 102 09/12/2023   CO2 28 09/12/2023   BUN 15 09/12/2023   CREATININE 0.88 09/12/2023   GFR 99.25 09/12/2023   CALCIUM 9.6 09/12/2023   PROT 7.2 09/12/2023   ALBUMIN 4.8 09/12/2023   BILITOT 0.5  09/12/2023   ALKPHOS 66 09/12/2023   AST 20 09/12/2023   ALT 15 09/12/2023   ANIONGAP 12 08/16/2014   Last lipids Lab Results  Component Value Date   CHOL 158 09/12/2023   HDL 52.80 09/12/2023   LDLCALC 87 09/12/2023   TRIG 87.0  09/12/2023   CHOLHDL 3 09/12/2023   Last hemoglobin A1c Lab Results  Component Value Date   HGBA1C 5.6 09/12/2023   Last thyroid  functions Lab Results  Component Value Date   TSH 1.41 09/12/2023   Last vitamin D No results found for: "25OHVITD2", "25OHVITD3", "VD25OH" Last vitamin B12 and Folate No results found for: "VITAMINB12", "FOLATE"    The 10-year ASCVD risk score (Arnett DK, et al., 2019) is: 4.8%  Assessment & Plan:  Night terror -     Prazosin HCl; Take 1 capsule (1 mg total) by mouth at bedtime.  Dispense: 90 capsule; Refill: 0 -     Ambulatory referral to Behavioral Health  GAD (generalized anxiety disorder) -     clonazePAM ; Take 1 tablet (0.5 mg total) by mouth 2 (two) times daily.  Dispense: 60 tablet; Refill: 0 -     Drug Monitoring Panel [id number] , Urine -     Ambulatory referral to Behavioral Health  Severe anxiety -     clonazePAM ; Take 1 tablet (0.5 mg total) by mouth 2 (two) times daily.  Dispense: 60 tablet; Refill: 0 -     Drug Monitoring Panel [id number] , Urine -     Ambulatory referral to Behavioral Health  Psychophysiological insomnia -     QUEtiapine  Fumarate; Take 1.5 tablets (300 mg total) by mouth at bedtime.  Dispense: 136 tablet; Refill: 0 -     Drug Monitoring Panel [id number] , Urine -     Ambulatory referral to Behavioral Health   Assessment and Plan    Depression Significant improvement with clonazepam . PHQ-9 score decreased from 25 to 16. Discussed clonazepam 's addictive potential and insurance issues. Psychiatric referral considered but current treatment continues with adjustments.  - Continue clonazepam , change to twice daily scheduled, 60 tablets for 30 days. - Discuss risks of clonazepam , including  addiction and potential for seizures if abruptly stopped. - Consider psychiatric referral for alternative medication management if needed, currently declined.  Anxiety disorder Ongoing symptoms. Current treatment includes fluoxetine , propranolol , buspirone  and clonazepam . Considering prazosin for anxiety-related nightmares and insomnia due to its specific use for nightmares and potential to improve sleep quality. - Discontinue propranolol . - Start prazosin for nightmares and insomnia, one tablet at night before bed. - clonazepam  BID, drug screen obtained in office today - continue prozac , buspar  and seroquel . - Advise to stay hydrated to prevent lightheadedness due to potential blood pressure lowering effects of prazosin. - placed another referral for therapy; pt would benefit significantly from this however insurance in the past has not covered these sessions and pt cannot afford out of pocket. Will try to find medicaid preferred provider  Insomnia related to nightmares Insomnia persists with nightmares causing frequent awakenings and panic attacks. Current treatment includes Seroquel , but nightmares remain problematic. Prazosin considered to address nightmares specifically. - Continue Seroquel , adjust dosage to 300 mg nightly. - Start prazosin for nightmares, one tablet at night before bed. - Monitor for improvement in sleep quality and reduction in nightmares.        Return in about 4 weeks (around 02/06/2024).   Mandy Second, PA

## 2024-01-11 LAB — DRUG MONITORING PANEL 376104, URINE
Amphetamines: NEGATIVE ng/mL (ref <500)
Barbiturates: NEGATIVE ng/mL (ref <300)
Benzodiazepines: NEGATIVE ng/mL (ref <100)
Cocaine Metabolite: NEGATIVE ng/mL (ref <150)
Desmethyltramadol: NEGATIVE ng/mL (ref <100)
Opiates: NEGATIVE ng/mL (ref <100)
Oxycodone: NEGATIVE ng/mL (ref <100)
Tramadol: NEGATIVE ng/mL (ref <100)

## 2024-01-11 LAB — DM TEMPLATE

## 2024-01-18 ENCOUNTER — Other Ambulatory Visit: Payer: Self-pay

## 2024-01-18 DIAGNOSIS — F419 Anxiety disorder, unspecified: Secondary | ICD-10-CM

## 2024-01-18 DIAGNOSIS — F411 Generalized anxiety disorder: Secondary | ICD-10-CM

## 2024-01-18 MED ORDER — BUSPIRONE HCL 15 MG PO TABS: 15.0000 mg | ORAL_TABLET | Freq: Two times a day (BID) | ORAL | 1 refills | Status: DC

## 2024-02-07 ENCOUNTER — Encounter: Payer: Self-pay | Admitting: Urgent Care

## 2024-02-07 ENCOUNTER — Telehealth: Payer: Self-pay

## 2024-02-07 ENCOUNTER — Ambulatory Visit (INDEPENDENT_AMBULATORY_CARE_PROVIDER_SITE_OTHER): Admitting: Urgent Care

## 2024-02-07 DIAGNOSIS — F411 Generalized anxiety disorder: Secondary | ICD-10-CM | POA: Diagnosis not present

## 2024-02-07 DIAGNOSIS — F419 Anxiety disorder, unspecified: Secondary | ICD-10-CM

## 2024-02-07 DIAGNOSIS — F514 Sleep terrors [night terrors]: Secondary | ICD-10-CM

## 2024-02-07 DIAGNOSIS — F5104 Psychophysiologic insomnia: Secondary | ICD-10-CM

## 2024-02-07 MED ORDER — QUETIAPINE FUMARATE 300 MG PO TABS: 300.0000 mg | ORAL_TABLET | Freq: Every day | ORAL | 1 refills | Status: DC

## 2024-02-07 MED ORDER — CLONAZEPAM 0.5 MG PO TABS: 0.5000 mg | ORAL_TABLET | Freq: Two times a day (BID) | ORAL | 2 refills | Status: DC

## 2024-02-07 MED ORDER — PRAZOSIN HCL 1 MG PO CAPS: 1.0000 mg | ORAL_CAPSULE | Freq: Every day | ORAL | 0 refills | Status: DC

## 2024-02-07 NOTE — Telephone Encounter (Signed)
 Called and spoke with pharmacist. I am the only one writing his clonazepam . PDMP aware was reviewed and was appropriate. Pharmacist stated that she is required to call and notify me that he was receiving another controlled (suboxone) from another prescriber, which I was aware of. There is no change to treatment plan. PDMP is accurate and correct. Thank you.

## 2024-02-07 NOTE — Telephone Encounter (Signed)
 Copied from CRM [id number]. Topic: Clinical - Prescription Issue >> Feb 07, 2024 10:14 AM Danae Duncans wrote: Reason for CRM: [facility] ask are PCP aware he get the clonazePAM  (KLONOPIN ) 0.5 MG tablet from another provider as well - Pharmacist can be reach at [phone]

## 2024-02-07 NOTE — Telephone Encounter (Signed)
 No further action needed.

## 2024-02-07 NOTE — Progress Notes (Signed)
 Established Patient Office Visit  Subjective:  Patient ID: Jacob Rhodes, male    DOB: March 18, 1971  Age: 53 y.o. MRN: [id number]  Chief Complaint  Patient presents with   Follow-up    4 week follow up.    HPI   Discussed the use of AI scribe software for clinical note transcription with the patient, who gave verbal consent to proceed.  History of Present Illness   Jacob Rhodes is a 53 year old male who presents for follow-up regarding anxiety and sleep issues.  He notes an improvement in his overall condition, with better sleep and more good days than bad. His appetite has improved, leading to a slight weight gain after previously decreasing from 170-180 lbs to 151 lbs.  He takes clonazepam  for anxiety, typically one tablet after work and another as needed, usually totaling two tablets per day. He avoids taking it before work to prevent drowsiness. He is currently on a half-tablet dose and finds the medication significantly helpful.  For sleep, he takes Seroquel , one and a half tablets (300 mg) at night, and prazosin , which he believes helps with his dreams. He still experiences dreams but describes them as less distressing. Overall, he has been sleeping better.  He is no longer taking propranolol . He has a six-month supply of fluoxetine  and Buspar , which were recently refilled.  He has [employer], which has not covered therapy or counseling services, resulting in a significant bill from a previous therapist.      Patient Active Problem List   Diagnosis Date Noted   GAD (generalized anxiety disorder) 09/22/2023   Severe anxiety 09/12/2023   Night sweats 09/12/2023   Chest heaviness 09/12/2023   Weight loss 09/12/2023   Tremulousness 09/12/2023   Current every day smoker 09/12/2023   Psychophysiological insomnia 09/12/2023   Spinal stenosis of cervical region 08/19/2014   Past Medical History:  Diagnosis Date   Anxiety    Depression    Headache     Pinched nerve    Shortness of breath dyspnea    anxiety related   Substance abuse (HCC)    Past Surgical History:  Procedure Laterality Date   ANTERIOR CERVICAL DECOMP/DISCECTOMY FUSION N/A 08/19/2014   Procedure: cervical three -four anterior cervical decompression fusion with peek and plate;  Surgeon: Ferris Hua, MD;  Location: [facility];  Service: Neurosurgery;  Laterality: N/A;   plyoric stenosis surgery as infant     Social History   Tobacco Use   Smoking status: Every Day    Current packs/day: 0.25    Average packs/day: 0.3 packs/day for 15.0 years (3.8 ttl pk-yrs)    Types: Cigarettes, Cigars  Substance Use Topics   Alcohol use: No   Drug use: Not Currently    Types: Cocaine, Hydrocodone, Marijuana, Morphine, Oxycodone       ROS: as noted in HPI  Objective:     BP 123/72   Pulse 63   Wt 165 lb 6.4 oz (75 kg)   SpO2 98%   BMI 23.07 kg/m  BP Readings from Last 3 Encounters:  02/07/24 123/72  01/09/24 112/72  12/13/23 112/78   Wt Readings from Last 3 Encounters:  02/07/24 165 lb 6.4 oz (75 kg)  01/09/24 151 lb 12.8 oz (68.9 kg)  12/13/23 154 lb 6.4 oz (70 kg)      Physical Exam Vitals and nursing note reviewed.  Constitutional:      General: He is not in acute distress.    Appearance: Normal  appearance. He is not ill-appearing, toxic-appearing or diaphoretic.  HENT:     Head: Normocephalic and atraumatic.     Right Ear: External ear normal.     Left Ear: External ear normal.     Nose: Nose normal.  Eyes:     General: No scleral icterus.    Pupils: Pupils are equal, round, and reactive to light.  Cardiovascular:     Rate and Rhythm: Normal rate.  Pulmonary:     Effort: Pulmonary effort is normal. No respiratory distress.  Skin:    General: Skin is warm and dry.     Findings: No erythema or rash.  Neurological:     General: No focal deficit present.     Mental Status: He is alert and oriented to person, place, and time.      No results  found for any visits on 02/07/24.  Last CBC Lab Results  Component Value Date   WBC 5.6 09/12/2023   HGB 15.0 09/12/2023   HCT 44.0 09/12/2023   MCV 92.4 09/12/2023   MCH 32.0 08/16/2014   RDW 13.2 09/12/2023   PLT 220.0 09/12/2023   Last metabolic panel Lab Results  Component Value Date   GLUCOSE 100 (H) 09/12/2023   NA 139 09/12/2023   K 4.2 09/12/2023   CL 102 09/12/2023   CO2 28 09/12/2023   BUN 15 09/12/2023   CREATININE 0.88 09/12/2023   GFR 99.25 09/12/2023   CALCIUM 9.6 09/12/2023   PROT 7.2 09/12/2023   ALBUMIN 4.8 09/12/2023   BILITOT 0.5 09/12/2023   ALKPHOS 66 09/12/2023   AST 20 09/12/2023   ALT 15 09/12/2023   ANIONGAP 12 08/16/2014   Last lipids Lab Results  Component Value Date   CHOL 158 09/12/2023   HDL 52.80 09/12/2023   LDLCALC 87 09/12/2023   TRIG 87.0 09/12/2023   CHOLHDL 3 09/12/2023   Last hemoglobin A1c Lab Results  Component Value Date   HGBA1C 5.6 09/12/2023   Last thyroid  functions Lab Results  Component Value Date   TSH 1.41 09/12/2023   Last vitamin D No results found for: "25OHVITD2", "25OHVITD3", "VD25OH" Last vitamin B12 and Folate No results found for: "VITAMINB12", "FOLATE"    The 10-year ASCVD risk score (Arnett DK, et al., 2019) is: 5.7%  Assessment & Plan:  GAD (generalized anxiety disorder) -     clonazePAM ; Take 1 tablet (0.5 mg total) by mouth 2 (two) times daily.  Dispense: 60 tablet; Refill: 2  Severe anxiety -     clonazePAM ; Take 1 tablet (0.5 mg total) by mouth 2 (two) times daily.  Dispense: 60 tablet; Refill: 2  Psychophysiological insomnia -     QUEtiapine  Fumarate; Take 1 tablet (300 mg total) by mouth at bedtime.  Dispense: 90 tablet; Refill: 1  Night terror -     Prazosin  HCl; Take 1 capsule (1 mg total) by mouth at bedtime.  Dispense: 90 capsule; Refill: 0  Assessment and Plan    Anxiety Symptoms improving with clonazepam . Current regimen effective but not on strict schedule. Chosen for  longer onset to reduce daytime drowsiness. - Adjust clonazepam  to half to full tablet in the morning and one tablet in the afternoon or early evening. - Call in 30-day supply of clonazepam  with two refills. - Monitor for drowsiness or sedation with morning dose. - Consider dose adjustment if symptoms persist.  Sleep disturbance Improved sleep quality with Seroquel  and prazosin . Seroquel  aids sleep, prazosin  improves quality. - Continue Seroquel  at 300  mg at night. - Call in Seroquel  prescription for 300 mg tablets. - Call in prazosin  prescription with refills.  Nightmares Improved with prazosin , resulting in better quality dreams and fewer distressing nightmares. - Continue prazosin  as prescribed.  Weight loss Weight increase noted after previous loss. Appetite improved due to improvement in anxiety symptoms. - Monitor weight and appetite.         No follow-ups on file.  Follow up in 3 months, sooner as needed.   Mandy Second, PA

## 2024-02-07 NOTE — Patient Instructions (Addendum)
 Please schedule your three month follow up for the location listed below. Return for recheck around mid-August.  Take your clonazepam  scheduled - in the morning upon awakening and after work. Continue 300mg  seroquel  at night with prazosin .  [facility] at [facility] [address], [place], [place] [address] Phone: [phone]

## 2024-04-11 ENCOUNTER — Ambulatory Visit (INDEPENDENT_AMBULATORY_CARE_PROVIDER_SITE_OTHER): Admitting: Urgent Care

## 2024-04-11 ENCOUNTER — Encounter: Payer: Self-pay | Admitting: Urgent Care

## 2024-04-11 VITALS — BP 102/67 | HR 86 | Ht 71.0 in | Wt 179.0 lb

## 2024-04-11 DIAGNOSIS — F5104 Psychophysiologic insomnia: Secondary | ICD-10-CM

## 2024-04-11 DIAGNOSIS — F411 Generalized anxiety disorder: Secondary | ICD-10-CM

## 2024-04-11 DIAGNOSIS — F514 Sleep terrors [night terrors]: Secondary | ICD-10-CM

## 2024-04-11 DIAGNOSIS — F419 Anxiety disorder, unspecified: Secondary | ICD-10-CM | POA: Diagnosis not present

## 2024-04-11 DIAGNOSIS — F339 Major depressive disorder, recurrent, unspecified: Secondary | ICD-10-CM | POA: Diagnosis not present

## 2024-04-11 MED ORDER — CARIPRAZINE HCL 1.5 MG PO CAPS: 1.5000 mg | ORAL_CAPSULE | Freq: Every day | ORAL | 3 refills | Status: DC

## 2024-04-11 NOTE — Progress Notes (Unsigned)
 Established Patient Office Visit  Subjective:  Patient ID: Jacob Rhodes, male    DOB: [birth year]  Age: 53 y.o. MRN: [id number]  Chief Complaint  Patient presents with   Mood    HPI  Patient Active Problem List   Diagnosis Date Noted   GAD (generalized anxiety disorder) 09/22/2023   Severe anxiety 09/12/2023   Night sweats 09/12/2023   Chest heaviness 09/12/2023   Weight loss 09/12/2023   Tremulousness 09/12/2023   Current every day smoker 09/12/2023   Psychophysiological insomnia 09/12/2023   Spinal stenosis of cervical region 08/19/2014   Past Medical History:  Diagnosis Date   Anxiety    Depression    Headache    Pinched nerve    Shortness of breath dyspnea    anxiety related   Substance abuse (HCC)    Past Surgical History:  Procedure Laterality Date   ANTERIOR CERVICAL DECOMP/DISCECTOMY FUSION N/A 08/19/2014   Procedure: cervical three -four anterior cervical decompression fusion with peek and plate;  Surgeon: Arley SHAUNNA Helling, MD;  Location: [facility];  Service: Neurosurgery;  Laterality: N/A;   plyoric stenosis surgery as infant     Social History   Tobacco Use   Smoking status: Every Day    Current packs/day: 0.25    Average packs/day: 0.3 packs/day for 15.0 years (3.8 ttl pk-yrs)    Types: Cigarettes, Cigars  Substance Use Topics   Alcohol use: No   Drug use: Not Currently    Types: Cocaine, Hydrocodone, Marijuana, Morphine, Oxycodone       ROS: as noted in HPI  Objective:     BP 102/67 (BP Location: Left Arm, Patient Position: Sitting, Cuff Size: Normal)   Pulse 86   Ht 5' 11 (1.803 m)   Wt 179 lb (81.2 kg)   SpO2 99%   BMI 24.97 kg/m  BP Readings from Last 3 Encounters:  04/11/24 102/67  02/07/24 123/72  01/09/24 112/72   Wt Readings from Last 3 Encounters:  04/11/24 179 lb (81.2 kg)  02/07/24 165 lb 6.4 oz (75 kg)  01/09/24 151 lb 12.8 oz (68.9 kg)      Physical Exam   No results found for any visits on 04/11/24.  Last  CBC Lab Results  Component Value Date   WBC 5.6 09/12/2023   HGB 15.0 09/12/2023   HCT 44.0 09/12/2023   MCV 92.4 09/12/2023   MCH 32.0 08/16/2014   RDW 13.2 09/12/2023   PLT 220.0 09/12/2023   Last metabolic panel Lab Results  Component Value Date   GLUCOSE 100 (H) 09/12/2023   NA 139 09/12/2023   K 4.2 09/12/2023   CL 102 09/12/2023   CO2 28 09/12/2023   BUN 15 09/12/2023   CREATININE 0.88 09/12/2023   GFR 99.25 09/12/2023   CALCIUM 9.6 09/12/2023   PROT 7.2 09/12/2023   ALBUMIN 4.8 09/12/2023   BILITOT 0.5 09/12/2023   ALKPHOS 66 09/12/2023   AST 20 09/12/2023   ALT 15 09/12/2023   ANIONGAP 12 08/16/2014   Last lipids Lab Results  Component Value Date   CHOL 158 09/12/2023   HDL 52.80 09/12/2023   LDLCALC 87 09/12/2023   TRIG 87.0 09/12/2023   CHOLHDL 3 09/12/2023   Last hemoglobin A1c Lab Results  Component Value Date   HGBA1C 5.6 09/12/2023   Last thyroid  functions Lab Results  Component Value Date   TSH 1.41 09/12/2023   Last vitamin D No results found for: 25OHVITD2, 25OHVITD3, VD25OH Last vitamin B12 and Folate  No results found for: VITAMINB12, FOLATE    The 10-year ASCVD risk score (Arnett DK, et al., 2019) is: 4.1%  Assessment & Plan:  There are no diagnoses linked to this encounter.   No follow-ups on file.   Benton LITTIE Gave, PA

## 2024-04-11 NOTE — Patient Instructions (Signed)
 Continue all meds as ordered. Send mychart message in 30 days regarding new medication. Will increase klonopin  if new medication does not help.

## 2024-04-24 ENCOUNTER — Other Ambulatory Visit: Payer: Self-pay | Admitting: Urgent Care

## 2024-04-24 DIAGNOSIS — F419 Anxiety disorder, unspecified: Secondary | ICD-10-CM

## 2024-04-24 DIAGNOSIS — F411 Generalized anxiety disorder: Secondary | ICD-10-CM

## 2024-05-01 ENCOUNTER — Other Ambulatory Visit: Payer: Self-pay | Admitting: Urgent Care

## 2024-05-01 DIAGNOSIS — F5104 Psychophysiologic insomnia: Secondary | ICD-10-CM

## 2024-05-09 ENCOUNTER — Encounter: Payer: Self-pay | Admitting: Urgent Care

## 2024-05-09 DIAGNOSIS — F411 Generalized anxiety disorder: Secondary | ICD-10-CM

## 2024-05-09 DIAGNOSIS — F419 Anxiety disorder, unspecified: Secondary | ICD-10-CM

## 2024-05-10 MED ORDER — CLONAZEPAM 0.5 MG PO TABS: 0.5000 mg | ORAL_TABLET | Freq: Two times a day (BID) | ORAL | 2 refills | Status: DC

## 2024-05-10 NOTE — Telephone Encounter (Signed)
 Requesting rx rf of clonazepam   Last written 02/07/2024 Last OV 04/11/2024 Upcoming appt =07/12/2024

## 2024-06-12 ENCOUNTER — Other Ambulatory Visit: Payer: Self-pay | Admitting: Urgent Care

## 2024-06-12 DIAGNOSIS — F411 Generalized anxiety disorder: Secondary | ICD-10-CM

## 2024-07-12 ENCOUNTER — Ambulatory Visit (INDEPENDENT_AMBULATORY_CARE_PROVIDER_SITE_OTHER): Admitting: Urgent Care

## 2024-07-12 DIAGNOSIS — F5104 Psychophysiologic insomnia: Secondary | ICD-10-CM | POA: Diagnosis not present

## 2024-07-12 DIAGNOSIS — F411 Generalized anxiety disorder: Secondary | ICD-10-CM

## 2024-07-12 DIAGNOSIS — F339 Major depressive disorder, recurrent, unspecified: Secondary | ICD-10-CM

## 2024-07-12 DIAGNOSIS — F419 Anxiety disorder, unspecified: Secondary | ICD-10-CM | POA: Diagnosis not present

## 2024-07-12 DIAGNOSIS — F514 Sleep terrors [night terrors]: Secondary | ICD-10-CM | POA: Diagnosis not present

## 2024-07-12 MED ORDER — BUSPIRONE HCL 15 MG PO TABS: 15.0000 mg | ORAL_TABLET | Freq: Two times a day (BID) | ORAL | 1 refills | Status: AC

## 2024-07-12 MED ORDER — PRAZOSIN HCL 1 MG PO CAPS
1.0000 mg | ORAL_CAPSULE | Freq: Every day | ORAL | 0 refills | Status: AC
Start: 2024-07-12 — End: ?

## 2024-07-12 MED ORDER — FLUOXETINE HCL 40 MG PO CAPS: 40.0000 mg | ORAL_CAPSULE | Freq: Every day | ORAL | 1 refills | Status: AC

## 2024-07-12 MED ORDER — CLONAZEPAM 0.5 MG PO TABS: 0.5000 mg | ORAL_TABLET | Freq: Three times a day (TID) | ORAL | 2 refills | Status: DC | PRN

## 2024-07-12 MED ORDER — QUETIAPINE FUMARATE 300 MG PO TABS: 300.0000 mg | ORAL_TABLET | Freq: Every day | ORAL | 1 refills | Status: AC

## 2024-07-12 MED ORDER — CARIPRAZINE HCL 1.5 MG PO CAPS: 1.5000 mg | ORAL_CAPSULE | Freq: Every day | ORAL | 1 refills | Status: AC

## 2024-07-12 NOTE — Progress Notes (Signed)
 Established Patient Office Visit  Subjective:  Patient ID: Jacob Rhodes, male    DOB: [birth year]  Age: 53 y.o. MRN: [id number]  Chief Complaint  Patient presents with   Medical Management of Chronic Issues    HPI  Discussed the use of AI scribe software for clinical note transcription with the patient, who gave verbal consent to proceed.  History of Present Illness   Jacob Rhodes is a 53 year old male who presents for medication management and follow-up for anxiety and depression.  He experiences persistent anxiety, feeling anxious constantly, and has unexpected panic attacks that leave him overwhelmed and unable to perform daily tasks. He describes his brain as going into 'overload' and sometimes feels unable to get out of bed due to being overwhelmed.  He is currently taking clonazepam  0.5 mg twice a day for anxiety, but feels it has become less effective over time. He does not experience grogginess or drowsiness from the medication and feels safe driving while on it. He is also taking buspirone  15 mg twice a day, Vraylar , Seroquel  300 mg at bedtime, prazosin  at bedtime, and fluoxetine  40 mg. Prazosin  has been helpful for sleep, although he still experiences vivid dreams, which are not always nightmares.  He has encountered difficulties accessing therapy due to insurance issues, as many therapists do not accept Medicaid. He has previously been told that his insurance was accepted, only to later receive a bill for services. He has not yet seen a therapist at the current medical center despite being referred.  He has been without prazosin  for two days due to a delay in prescription refills. No grogginess or drowsiness from clonazepam , and he feels safe driving. He reports vivid dreams but not always nightmares, and he does not wake up with night sweats or in a panic.       Patient Active Problem List   Diagnosis Date Noted   GAD (generalized anxiety disorder) 09/22/2023    Severe anxiety 09/12/2023   Night sweats 09/12/2023   Chest heaviness 09/12/2023   Weight loss 09/12/2023   Tremulousness 09/12/2023   Current every day smoker 09/12/2023   Psychophysiological insomnia 09/12/2023   Spinal stenosis of cervical region 08/19/2014   Past Medical History:  Diagnosis Date   Anxiety    Depression    Headache    Pinched nerve    Shortness of breath dyspnea    anxiety related   Substance abuse (HCC)    Past Surgical History:  Procedure Laterality Date   ANTERIOR CERVICAL DECOMP/DISCECTOMY FUSION N/A 08/19/2014   Procedure: cervical three -four anterior cervical decompression fusion with peek and plate;  Surgeon: Arley SHAUNNA Helling, MD;  Location: [facility];  Service: Neurosurgery;  Laterality: N/A;   plyoric stenosis surgery as infant     Social History   Tobacco Use   Smoking status: Every Day    Current packs/day: 0.25    Average packs/day: 0.3 packs/day for 15.0 years (3.8 ttl pk-yrs)    Types: Cigarettes, Cigars  Substance Use Topics   Alcohol use: No   Drug use: Not Currently    Types: Cocaine, Hydrocodone, Marijuana, Morphine, Oxycodone       ROS: as noted in HPI  Objective:     BP 126/73   Pulse 64   Ht 5' 11 (1.803 m)   Wt 185 lb (83.9 kg)   SpO2 97%   BMI 25.80 kg/m  BP Readings from Last 3 Encounters:  07/12/24 126/73  04/11/24 102/67  02/07/24  123/72   Wt Readings from Last 3 Encounters:  07/12/24 185 lb (83.9 kg)  04/11/24 179 lb (81.2 kg)  02/07/24 165 lb 6.4 oz (75 kg)      Physical Exam Vitals and nursing note reviewed. Exam conducted with a chaperone present.  Constitutional:      General: He is not in acute distress.    Appearance: Normal appearance. He is not ill-appearing, toxic-appearing or diaphoretic.  HENT:     Head: Normocephalic and atraumatic.     Right Ear: External ear normal.     Left Ear: External ear normal.     Nose: Nose normal.     Mouth/Throat:     Mouth: Mucous membranes are moist.   Eyes:     General: No scleral icterus.    Pupils: Pupils are equal, round, and reactive to light.  Cardiovascular:     Rate and Rhythm: Normal rate and regular rhythm.     Heart sounds: No murmur heard. Pulmonary:     Effort: Pulmonary effort is normal. No respiratory distress.     Breath sounds: Normal breath sounds. No stridor. No wheezing or rhonchi.  Musculoskeletal:     Cervical back: Normal range of motion and neck supple. No rigidity or tenderness.  Lymphadenopathy:     Cervical: No cervical adenopathy.  Skin:    General: Skin is warm and dry.     Findings: No erythema or rash.  Neurological:     General: No focal deficit present.     Mental Status: He is alert and oriented to person, place, and time.  Psychiatric:        Mood and Affect: Mood is anxious.        Behavior: Behavior normal.      No results found for any visits on 07/12/24.  Last CBC Lab Results  Component Value Date   WBC 5.6 09/12/2023   HGB 15.0 09/12/2023   HCT 44.0 09/12/2023   MCV 92.4 09/12/2023   MCH 32.0 08/16/2014   RDW 13.2 09/12/2023   PLT 220.0 09/12/2023   Last metabolic panel Lab Results  Component Value Date   GLUCOSE 100 (H) 09/12/2023   NA 139 09/12/2023   K 4.2 09/12/2023   CL 102 09/12/2023   CO2 28 09/12/2023   BUN 15 09/12/2023   CREATININE 0.88 09/12/2023   GFR 99.25 09/12/2023   CALCIUM 9.6 09/12/2023   PROT 7.2 09/12/2023   ALBUMIN 4.8 09/12/2023   BILITOT 0.5 09/12/2023   ALKPHOS 66 09/12/2023   AST 20 09/12/2023   ALT 15 09/12/2023   ANIONGAP 12 08/16/2014   Last lipids Lab Results  Component Value Date   CHOL 158 09/12/2023   HDL 52.80 09/12/2023   LDLCALC 87 09/12/2023   TRIG 87.0 09/12/2023   CHOLHDL 3 09/12/2023   Last hemoglobin A1c Lab Results  Component Value Date   HGBA1C 5.6 09/12/2023   Last thyroid  functions Lab Results  Component Value Date   TSH 1.41 09/12/2023   Last vitamin D No results found for: 25OHVITD2, 25OHVITD3,  VD25OH Last vitamin B12 and Folate No results found for: VITAMINB12, FOLATE    The 10-year ASCVD risk score (Arnett DK, et al., 2019) is: 5.9%  Assessment & Plan:  GAD (generalized anxiety disorder) -     Cariprazine  HCl; Take 1 capsule (1.5 mg total) by mouth daily.  Dispense: 90 capsule; Refill: 1 -     FLUoxetine  HCl; Take 1 capsule (40 mg total) by mouth daily.  Dispense: 90 capsule; Refill: 1 -     busPIRone  HCl; Take 1 tablet (15 mg total) by mouth 2 (two) times daily.  Dispense: 180 tablet; Refill: 1 -     clonazePAM ; Take 1 tablet (0.5 mg total) by mouth 3 (three) times daily as needed for anxiety.  Dispense: 90 tablet; Refill: 2 -     Ambulatory referral to Behavioral Health  Depression, recurrent -     Cariprazine  HCl; Take 1 capsule (1.5 mg total) by mouth daily.  Dispense: 90 capsule; Refill: 1 -     Ambulatory referral to Behavioral Health  Night terror -     Prazosin  HCl; Take 1 capsule (1 mg total) by mouth at bedtime.  Dispense: 90 capsule; Refill: 0  Severe anxiety -     busPIRone  HCl; Take 1 tablet (15 mg total) by mouth 2 (two) times daily.  Dispense: 180 tablet; Refill: 1 -     clonazePAM ; Take 1 tablet (0.5 mg total) by mouth 3 (three) times daily as needed for anxiety.  Dispense: 90 tablet; Refill: 2 -     ToxASSURE Select 13 (MW), Urine -     Ambulatory referral to Behavioral Health  Psychophysiological insomnia -     QUEtiapine  Fumarate; Take 1 tablet (300 mg total) by mouth at bedtime.  Dispense: 90 tablet; Refill: 1 -     Ambulatory referral to Behavioral Health  Assessment and Plan    Generalized anxiety disorder Persistent anxiety with panic attacks. Clonazepam  tolerance noted. Therapy access limited by insurance, but options available within the medical center. - Increase clonazepam  to 0.5 mg three times a day. - Continue buspirone  15 mg twice a day. - Continue Vraylar  as prescribed. - Refer to Lorrene Hasten, therapist, to explore therapy  options and confirm insurance coverage. - urine drug screen obtained today  Depression Symptoms include feeling down and sadness. Vraylar  and fluoxetine  are current treatments. - Continue Vraylar  as prescribed. - Continue fluoxetine  40 mg as prescribed.  Sleep terrors Experiencing vivid dreams nightly. Prazosin  effectively reduces nightmare severity. - Continue prazosin  at bedtime. - Ensure prazosin  is refilled and synchronized with other medications.       Schedule annual PE in 2 months. Come fasting  Return in about 2 months (around 09/11/2024).   Benton LITTIE Gave, PA

## 2024-07-12 NOTE — Patient Instructions (Addendum)
 I have refilled your medications as previously prescribed. I have increased your clonazepam  to three times daily. Please schedule your annual physical in 2 months - come fasting for labs.  Please schedule office visit with therapist here in our office.

## 2024-07-15 LAB — TOXASSURE SELECT 13 (MW), URINE

## 2024-08-20 ENCOUNTER — Ambulatory Visit: Admitting: Urgent Care

## 2024-08-23 ENCOUNTER — Encounter: Payer: Self-pay | Admitting: Urgent Care

## 2024-08-23 ENCOUNTER — Ambulatory Visit: Admitting: Urgent Care

## 2024-08-23 VITALS — BP 118/76 | HR 97 | Ht 71.0 in | Wt 187.0 lb

## 2024-08-23 DIAGNOSIS — F411 Generalized anxiety disorder: Secondary | ICD-10-CM | POA: Diagnosis not present

## 2024-08-23 DIAGNOSIS — F339 Major depressive disorder, recurrent, unspecified: Secondary | ICD-10-CM | POA: Diagnosis not present

## 2024-08-23 DIAGNOSIS — F419 Anxiety disorder, unspecified: Secondary | ICD-10-CM | POA: Diagnosis not present

## 2024-08-23 DIAGNOSIS — Z0000 Encounter for general adult medical examination without abnormal findings: Secondary | ICD-10-CM | POA: Diagnosis not present

## 2024-08-23 DIAGNOSIS — F514 Sleep terrors [night terrors]: Secondary | ICD-10-CM | POA: Diagnosis not present

## 2024-08-23 MED ORDER — PRAZOSIN HCL 1 MG PO CAPS: 1.0000 mg | ORAL_CAPSULE | Freq: Every day | ORAL | 1 refills | Status: AC

## 2024-08-23 MED ORDER — CLONAZEPAM 1 MG PO TABS: 1.0000 mg | ORAL_TABLET | Freq: Three times a day (TID) | ORAL | 2 refills | Status: AC | PRN

## 2024-08-23 NOTE — Progress Notes (Signed)
 Annual Wellness Visit     Patient: Jacob Rhodes, Male    DOB: 1971-07-22, 53 y.o.   MRN: [id number]  Subjective  Chief Complaint  Patient presents with   Follow-up    Jacob Rhodes is a 53 y.o. male who presents today for his Annual Wellness Visit. He reports consuming a general diet. The patient has a physically strenuous job, but has no regular exercise apart from work.  He generally feels fairly well. He reports sleeping poorly. He does not have additional problems to discuss today.   HPI  Discussed the use of AI scribe software for clinical note transcription with the patient, who gave verbal consent to proceed.  History of Present Illness   Jacob Rhodes is a 53 year old male who presents for an annual physical exam and medication refills.  He describes his overall condition as stable, with occasional fluctuations in how he feels. He sometimes feels better than at other times. He has been referred to behavioral health services but has not yet been scheduled for an appointment due to a referral error. He is currently waiting for a new referral to be processed.  His diet includes a variety of foods, such as vegetables and calcium-rich items. He feels he is getting enough calories and notes an improvement in appetite, weighing 187 pounds as of this morning. He does not engage in routine exercise outside of his physically demanding job, which involves climbing ladders daily. He experiences aches and pains but considers them manageable given his age and occupation.  His sleep is inconsistent, with disturbances primarily due to bad dreams. He is currently taking prazosin , which he believes he is still using. 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 He experiences dry mouth and manages it by drinking water frequently.  He quit smoking approximately six months ago and does not use  smokeless tobacco. He occasionally consumes THC Delta 9 drinks. He is currently taking clonazepam  three times a day, with a preference for taking two doses in the evening after work to help him relax. He is also taking Vraylar , which was started on October 30th, but he is uncertain about its effectiveness at this time.  He has not seen an eye doctor this year but plans to schedule an appointment soon. He does not see a dentist regularly but acknowledges the need to do so.      Vision:Within last year and Dental: No regular dental care    Patient Active Problem List   Diagnosis Date Noted   GAD (generalized anxiety disorder) 09/22/2023   Severe anxiety 09/12/2023   Night sweats 09/12/2023   Chest heaviness 09/12/2023   Weight loss 09/12/2023   Tremulousness 09/12/2023   Current every day smoker 09/12/2023   Psychophysiological insomnia 09/12/2023   Spinal stenosis of cervical region 08/19/2014   Past Medical History:  Diagnosis Date   Anxiety    Depression    Headache    Pinched nerve    Shortness of breath dyspnea    anxiety related   Substance abuse (HCC)    Past Surgical History:  Procedure Laterality Date   ANTERIOR CERVICAL DECOMP/DISCECTOMY FUSION N/A 08/19/2014   Procedure: cervical three -four anterior cervical decompression fusion with peek and plate;  Surgeon: Arley SHAUNNA Helling, MD;  Location: [facility];  Service: Neurosurgery;  Laterality: N/A;   plyoric stenosis surgery as infant     Social History[1]  Medications: Show/hide medication list[2]  Allergies[3]  Patient Care Team: Lowella Benton CROME, [place] as PCP - General (Physician Assistant)  ROS Complete 12 point ROS performed with all pertinent positives listed in HPI    Objective  BP 118/76   Pulse 97   Ht 5' 11 (1.803 m)   Wt 187 lb (84.8 kg)   SpO2 98%   BMI 26.08 kg/m  BP Readings from Last 3 Encounters:  08/23/24 118/76  07/12/24 126/73  04/11/24 102/67   Wt Readings from Last 3 Encounters:   08/23/24 187 lb (84.8 kg)  07/12/24 185 lb (83.9 kg)  04/11/24 179 lb (81.2 kg)      Physical Exam Vitals and nursing note reviewed.  Constitutional:      General: He is not in acute distress.    Appearance: Normal appearance. He is not ill-appearing, toxic-appearing or diaphoretic.  HENT:     Head: Normocephalic and atraumatic.     Right Ear: Tympanic membrane, ear canal and external ear normal. There is no impacted cerumen.     Left Ear: Tympanic membrane, ear canal and external ear normal. There is no impacted cerumen.     Nose: Nose normal.     Mouth/Throat:     Mouth: Mucous membranes are moist.     Dentition: Abnormal dentition. Dental caries present. No dental abscesses.     Pharynx: Oropharynx is clear. No oropharyngeal exudate or posterior oropharyngeal erythema.  Eyes:     General: No scleral icterus.       Right eye: No discharge.        Left eye: No discharge.     Extraocular Movements: Extraocular movements intact.     Pupils: Pupils are equal, round, and reactive to light.  Neck:     Thyroid : No thyroid  mass, thyromegaly or thyroid  tenderness.  Cardiovascular:     Rate and Rhythm: Normal rate and regular rhythm.     Pulses: Normal pulses.     Heart sounds: No murmur heard. Pulmonary:     Effort: Pulmonary effort is normal. No respiratory distress.     Breath sounds: Normal breath sounds. No stridor. No wheezing or rhonchi.  Abdominal:     General: Abdomen is flat. Bowel sounds are normal. There is no distension.     Palpations: Abdomen is soft. There is no mass.     Tenderness: There is no abdominal tenderness. There is no guarding.  Musculoskeletal:     Cervical back: Normal range of motion and neck supple. No rigidity or tenderness.     Right lower leg: No edema.     Left lower leg: No edema.  Lymphadenopathy:     Cervical: No cervical adenopathy.  Skin:    General: Skin is warm and dry.     Coloration: Skin is not jaundiced.     Findings: No  bruising, erythema or rash.  Neurological:     General: No focal deficit present.     Mental Status: He is alert and oriented to person, place, and time.     Sensory: No sensory deficit.     Motor: No weakness.  Psychiatric:        Mood and Affect: Mood normal.        Behavior: Behavior normal.     Most recent depression screenings:    08/23/2024    9:15 AM 04/11/2024    9:39 AM  PHQ 2/9 Scores  PHQ - 2 Score 4 3  PHQ- 9 Score 9 10  Data saved with a previous flowsheet row definition      08/23/2024    9:16 AM 04/11/2024    9:39 AM 01/09/2024    8:52 AM 09/12/2023    9:13 AM  GAD 7 : Generalized Anxiety Score  Nervous, Anxious, on Edge 2 2 2 3   Control/stop worrying 3 2 3 3   Worry too much - different things 3 3 3 3   Trouble relaxing 2 1 2 3   Restless 1 1 1 3   Easily annoyed or irritable 0 1 1 3   Afraid - awful might happen 2 3 3 3   Total GAD 7 Score 13 13 15 21   Anxiety Difficulty Very difficult Somewhat difficult Somewhat difficult Extremely difficult      08/23/2024    9:15 AM 04/11/2024    9:39 AM 01/09/2024    8:50 AM 09/12/2023    9:13 AM  Depression screen PHQ 2/9  Decreased Interest 2 1 2 3   Down, Depressed, Hopeless 2 2 2 3   PHQ - 2 Score 4 3 4 6   Altered sleeping 1 2 2 3   Tired, decreased energy 1 1 2 3   Change in appetite 0 0 3 3  Feeling bad or failure about yourself  2 2 2 3   Trouble concentrating 1 1 2 3   Moving slowly or fidgety/restless 0 1 1 3   Suicidal thoughts 0 0 0 1  PHQ-9 Score 9 10  16  25    Difficult doing work/chores Very difficult Somewhat difficult Somewhat difficult Extremely dIfficult     Data saved with a previous flowsheet row definition      Fall Screening Falls in the past year?: 0 Number of falls in past year: 0 Was there an injury with Fall?: 0 Fall Risk Category Calculator: 0 Patient Fall Risk Level: Low Fall Risk  Fall Risk Patient at Risk for Falls Due to: No Fall Risks Fall risk Follow up: Falls evaluation  completed    Vision/Hearing Screen: No results found.  Last CBC Lab Results  Component Value Date   WBC 5.2 08/23/2024   HGB 13.3 08/23/2024   HCT 40.1 08/23/2024   MCV 91 08/23/2024   MCH 30.3 08/23/2024   RDW 13.1 08/23/2024   PLT 190 08/23/2024   Last metabolic panel Lab Results  Component Value Date   GLUCOSE 111 (H) 08/23/2024   NA 142 08/23/2024   K 4.2 08/23/2024   CL 101 08/23/2024   CO2 25 08/23/2024   BUN 19 08/23/2024   CREATININE 0.94 08/23/2024   GFR 99.25 09/12/2023   CALCIUM 9.6 08/23/2024   PROT 6.9 08/23/2024   ALBUMIN 4.5 08/23/2024   LABGLOB 2.4 08/23/2024   BILITOT 0.5 08/23/2024   ALKPHOS 105 08/23/2024   AST 20 08/23/2024   ALT 11 08/23/2024   ANIONGAP 12 08/16/2014   Last lipids Lab Results  Component Value Date   CHOL 170 08/23/2024   HDL 50 08/23/2024   LDLCALC 110 (H) 08/23/2024   TRIG 52 08/23/2024   CHOLHDL 3.4 08/23/2024   Last hemoglobin A1c Lab Results  Component Value Date   HGBA1C 5.4 08/23/2024   Last thyroid  functions Lab Results  Component Value Date   TSH 3.310 08/23/2024   Last vitamin D No results found for: 25OHVITD2, 25OHVITD3, VD25OH Last vitamin B12 and Folate No results found for: VITAMINB12, FOLATE      Assessment & Plan   Annual wellness visit done today including the all of the following: Reviewed patient's Family and Medical History  Reviewed and updated list of patient's medical providers Assessment of cognitive impairment was done Assessed patient's functional ability Established a written schedule for health screening services Health Risk Assessent Completed and Reviewed  Exercise Activities and Dietary recommendations  Goals   Decrease anxiety and improve sleep quality     Immunization History  Administered Date(s) Administered   Pneumococcal Conjugate-13 10/13/2015   Tdap 04/19/2013    Health Maintenance  Topic Date Due   COVID-19 Vaccine (1) 09/06/2024 (Originally  03/26/1971)   Zoster Vaccines- Shingrix  (1 of 2) 11/21/2024 (Originally 09/26/1990)   Influenza Vaccine  12/11/2024 (Originally 04/13/2024)   Hepatitis B Vaccines 19-59 Average Risk (1 of 3 - 19+ 3-dose series) 04/11/2025 (Originally 09/26/1990)   DTaP/Tdap/Td (2 - Td or Tdap) 08/23/2025 (Originally 04/20/2023)   Pneumococcal Vaccine: 50+ Years (2 of 2 - PPSV23, PCV20, or PCV21) 08/23/2025 (Originally 12/08/2015)   Colonoscopy  11/21/2033   Hepatitis C Screening  Completed   HIV Screening  Completed   HPV VACCINES  Aged Out   Meningococcal B Vaccine  Aged Out     Discussed health benefits of physical activity, and encouraged him to engage in regular exercise appropriate for his age and condition.    Problem List Items Addressed This Visit       Other   Severe anxiety   Relevant Medications   clonazePAM  (KLONOPIN ) 1 MG tablet   Other Relevant Orders   Ambulatory referral to Behavioral Health   GAD (generalized anxiety disorder)   Relevant Medications   clonazePAM  (KLONOPIN ) 1 MG tablet   Other Relevant Orders   Ambulatory referral to Behavioral Health   Other Visit Diagnoses       Routine adult health maintenance    -  Primary   Relevant Orders   CBC with Differential/Platelet (Completed)   Hemoglobin A1c (Completed)   TSH (Completed)   Lipid panel (Completed)   Comprehensive metabolic panel with GFR (Completed)   PSA (Completed)     Night terror       Relevant Medications   prazosin  (MINIPRESS ) 1 MG capsule   Other Relevant Orders   Ambulatory referral to Behavioral Health     Depression, recurrent       Relevant Orders   Ambulatory referral to Behavioral Health      Assessment and Plan    General Health Maintenance Routine health maintenance discussed. Vaccines due but declined updates. - Consider updating pneumonia, tetanus, and shingles vaccines at a pharmacy. (Deferred today)   Generalized anxiety disorder Chronic anxiety managed with clonazepam . Discussed  potential tolerance with increased dosage. Therapy referral placed. - Increased clonazepam  to 1 mg, instructed to take one in the morning and one in the afternoon, with a third if needed prior to bed. - Placed referral for therapy with Lorrene Hasten. - Monitor for tolerance development and adjust treatment as necessary. - pt defers need for psych referral  Sleep terrors Prescribed prazosin . - Refilled prazosin  prescription.  Dental caries Likely exacerbated by Suboxone use and dry mouth. No current dental care provider. - Encouraged scheduling an appointment with a dentist.  History of nicotine dependence Quit smoking six months ago. No current use of smokeless tobacco. Occasional THC Delta-9 drink use. - Continue to abstain from smoking and smokeless tobacco.         Return in about 6 months (around 02/21/2025).     Benton LITTIE Gave, PA       [1]  Social History Tobacco Use   Smoking status: Former  Current packs/day: 0.25    Average packs/day: 0.3 packs/day for 15.0 years (3.8 ttl pk-yrs)    Types: Cigarettes, Cigars   Smokeless tobacco: Never   Tobacco comments:    Quit June 2025  Substance Use Topics   Alcohol use: No   Drug use: Not Currently    Types: Cocaine, Hydrocodone, Marijuana, Morphine, Oxycodone   [2]  Outpatient Medications Prior to Visit  Medication Sig   busPIRone  (BUSPAR ) 15 MG tablet Take 1 tablet (15 mg total) by mouth 2 (two) times daily.   cariprazine  (VRAYLAR ) 1.5 MG capsule Take 1 capsule (1.5 mg total) by mouth daily.   FLUoxetine  (PROZAC ) 40 MG capsule Take 1 capsule (40 mg total) by mouth daily.   QUEtiapine  (SEROQUEL ) 300 MG tablet Take 1 tablet (300 mg total) by mouth at bedtime.   SUBOXONE 8-2 MG FILM Place under the tongue 2 (two) times daily.   [DISCONTINUED] clonazePAM  (KLONOPIN ) 0.5 MG tablet Take 1 tablet (0.5 mg total) by mouth 3 (three) times daily as needed for anxiety.   [DISCONTINUED] prazosin  (MINIPRESS ) 1 MG capsule Take 1  capsule (1 mg total) by mouth at bedtime.   No facility-administered medications prior to visit.  [3] No Known Allergies

## 2024-08-23 NOTE — Patient Instructions (Signed)
 We completed your annual physical today.  Please consider updating these vaccines at your pharmacy: Tetanus Pneumonia Shingles  Continue all your home medications as ordered. I have increased your clonazepam  to 1mg  tabs. Take 2-3 times daily.  Please schedule an appointment with Lorrene Hasten here in the office for therapy.  Follow up in 6 months, sooner if needed

## 2024-08-24 ENCOUNTER — Ambulatory Visit: Payer: Self-pay | Admitting: Urgent Care

## 2024-08-24 LAB — COMPREHENSIVE METABOLIC PANEL WITH GFR
ALT: 11 IU/L (ref 0–44)
AST: 20 IU/L (ref 0–40)
Albumin: 4.5 g/dL (ref 3.8–4.9)
Alkaline Phosphatase: 105 IU/L (ref 47–123)
BUN/Creatinine Ratio: 20 (ref 9–20)
BUN: 19 mg/dL (ref 6–24)
Bilirubin Total: 0.5 mg/dL (ref 0.0–1.2)
CO2: 25 mmol/L (ref 20–29)
Calcium: 9.6 mg/dL (ref 8.7–10.2)
Chloride: 101 mmol/L (ref 96–106)
Creatinine, Ser: 0.94 mg/dL (ref 0.76–1.27)
Globulin, Total: 2.4 g/dL (ref 1.5–4.5)
Glucose: 111 mg/dL — ABNORMAL HIGH (ref 70–99)
Potassium: 4.2 mmol/L (ref 3.5–5.2)
Sodium: 142 mmol/L (ref 134–144)
Total Protein: 6.9 g/dL (ref 6.0–8.5)
eGFR: 98 mL/min/1.73 (ref >59)

## 2024-08-24 LAB — LIPID PANEL
Chol/HDL Ratio: 3.4 ratio (ref 0.0–5.0)
Cholesterol, Total: 170 mg/dL (ref 100–199)
HDL: 50 mg/dL (ref >39)
LDL Chol Calc (NIH): 110 mg/dL — ABNORMAL HIGH (ref 0–99)
Triglycerides: 52 mg/dL (ref 0–149)
VLDL Cholesterol Cal: 10 mg/dL (ref 5–40)

## 2024-08-24 LAB — CBC WITH DIFFERENTIAL/PLATELET
Basophils Absolute: 0.1 x10E3/uL (ref 0.0–0.2)
Basos: 1 %
EOS (ABSOLUTE): 0.2 x10E3/uL (ref 0.0–0.4)
Eos: 4 %
Hematocrit: 40.1 % (ref 37.5–51.0)
Hemoglobin: 13.3 g/dL (ref 13.0–17.7)
Immature Grans (Abs): 0 x10E3/uL (ref 0.0–0.1)
Immature Granulocytes: 0 %
Lymphocytes Absolute: 2 x10E3/uL (ref 0.7–3.1)
Lymphs: 39 %
MCH: 30.3 pg (ref 26.6–33.0)
MCHC: 33.2 g/dL (ref 31.5–35.7)
MCV: 91 fL (ref 79–97)
Monocytes Absolute: 0.4 x10E3/uL (ref 0.1–0.9)
Monocytes: 9 %
Neutrophils Absolute: 2.5 x10E3/uL (ref 1.4–7.0)
Neutrophils: 47 %
Platelets: 190 x10E3/uL (ref 150–450)
RBC: 4.39 x10E6/uL (ref 4.14–5.80)
RDW: 13.1 % (ref 11.6–15.4)
WBC: 5.2 x10E3/uL (ref 3.4–10.8)

## 2024-08-24 LAB — PSA: Prostate Specific Ag, Serum: 0.5 ng/mL (ref 0.0–4.0)

## 2024-08-24 LAB — HEMOGLOBIN A1C
Est. average glucose Bld gHb Est-mCnc: 108 mg/dL
Hgb A1c MFr Bld: 5.4 % (ref 4.8–5.6)

## 2024-08-24 LAB — TSH: TSH: 3.31 u[IU]/mL (ref 0.450–4.500)

## 2024-10-09 ENCOUNTER — Telehealth: Payer: Self-pay | Admitting: Pharmacy Technician

## 2024-10-09 ENCOUNTER — Other Ambulatory Visit (HOSPITAL_COMMUNITY): Payer: Self-pay

## 2024-10-09 NOTE — Telephone Encounter (Signed)
 Pharmacy Patient Advocate Encounter  Received notification from [employer] that Prior Authorization for Vraylar  1.5MG  capsules has been APPROVED from 10/09/24 to 10/09/25. Ran test claim, Copay is $4.00. This test claim was processed through [facility]- copay amounts may vary at other pharmacies due to pharmacy/plan contracts, or as the patient moves through the different stages of their insurance plan.   PA #/Case ID/Reference #: [id number]

## 2024-10-09 NOTE — Telephone Encounter (Signed)
 Pharmacy Patient Advocate Encounter   Received notification from [facility] KEY that prior authorization for Vraylar  1.5MG  capsules is required/requested.   Insurance verification completed.   The patient is insured through [facility] MEDICAID.   Per test claim: PA required; PA started via CoverMyMeds. KEY [id number] . Waiting for clinical questions to populate.

## 2025-02-21 ENCOUNTER — Ambulatory Visit: Admitting: Urgent Care
